# Patient Record
Sex: Female | Born: 1960 | Race: White | Hispanic: No | Smoking: Former smoker
Health system: Southern US, Community
[De-identification: ages and names within clinical notes are randomized; demographics above are authoritative.]

## PROBLEM LIST (undated history)

## (undated) DIAGNOSIS — G43909 Migraine, unspecified, not intractable, without status migrainosus: Secondary | ICD-10-CM

## (undated) DIAGNOSIS — F329 Major depressive disorder, single episode, unspecified: Secondary | ICD-10-CM

## (undated) DIAGNOSIS — Z9889 Other specified postprocedural states: Secondary | ICD-10-CM

## (undated) DIAGNOSIS — F32A Depression, unspecified: Secondary | ICD-10-CM

## (undated) DIAGNOSIS — F419 Anxiety disorder, unspecified: Secondary | ICD-10-CM

## (undated) HISTORY — DX: Anxiety disorder, unspecified: F41.9

## (undated) HISTORY — DX: Other specified postprocedural states: Z98.890

## (undated) HISTORY — DX: Migraine, unspecified, not intractable, without status migrainosus: G43.909

## (undated) HISTORY — PX: HERNIA REPAIR: SHX51

## (undated) HISTORY — PX: OTHER SURGICAL HISTORY: SHX169

## (undated) HISTORY — PX: ABDOMINAL HYSTERECTOMY: SHX81

## (undated) HISTORY — PX: TUBAL LIGATION: SHX77

## (undated) HISTORY — DX: Major depressive disorder, single episode, unspecified: F32.9

## (undated) HISTORY — DX: Depression, unspecified: F32.A

---

## 1999-11-29 HISTORY — PX: BREAST EXCISIONAL BIOPSY: SUR124

## 2006-08-20 ENCOUNTER — Emergency Department (HOSPITAL_COMMUNITY): Admission: EM | Admit: 2006-08-20 | Discharge: 2006-08-20 | Payer: Self-pay | Admitting: Emergency Medicine

## 2007-07-18 ENCOUNTER — Emergency Department (HOSPITAL_COMMUNITY): Admission: EM | Admit: 2007-07-18 | Discharge: 2007-07-18 | Payer: Self-pay | Admitting: Emergency Medicine

## 2008-11-28 HISTORY — PX: BREAST BIOPSY: SHX20

## 2011-09-09 LAB — URINALYSIS, ROUTINE W REFLEX MICROSCOPIC
Bilirubin Urine: NEGATIVE
Glucose, UA: NEGATIVE
Hgb urine dipstick: NEGATIVE
Nitrite: POSITIVE — AB
Protein, ur: NEGATIVE
Specific Gravity, Urine: 1.018
Urobilinogen, UA: 1
pH: 5

## 2011-09-09 LAB — URINE CULTURE: Colony Count: >=100000

## 2011-09-09 LAB — URINE MICROSCOPIC-ADD ON

## 2012-04-04 ENCOUNTER — Ambulatory Visit (INDEPENDENT_AMBULATORY_CARE_PROVIDER_SITE_OTHER): Payer: Self-pay

## 2012-04-04 DIAGNOSIS — K589 Irritable bowel syndrome without diarrhea: Secondary | ICD-10-CM

## 2012-04-20 ENCOUNTER — Ambulatory Visit (INDEPENDENT_AMBULATORY_CARE_PROVIDER_SITE_OTHER): Payer: Self-pay

## 2012-04-20 DIAGNOSIS — K589 Irritable bowel syndrome without diarrhea: Secondary | ICD-10-CM

## 2012-05-03 ENCOUNTER — Ambulatory Visit (INDEPENDENT_AMBULATORY_CARE_PROVIDER_SITE_OTHER): Payer: Self-pay

## 2012-05-03 DIAGNOSIS — K589 Irritable bowel syndrome without diarrhea: Secondary | ICD-10-CM

## 2012-05-18 ENCOUNTER — Ambulatory Visit (INDEPENDENT_AMBULATORY_CARE_PROVIDER_SITE_OTHER): Payer: Self-pay

## 2012-05-18 DIAGNOSIS — K589 Irritable bowel syndrome without diarrhea: Secondary | ICD-10-CM

## 2012-05-23 ENCOUNTER — Emergency Department (HOSPITAL_COMMUNITY): Payer: Managed Care, Other (non HMO)

## 2012-05-23 ENCOUNTER — Emergency Department (HOSPITAL_COMMUNITY)
Admission: EM | Admit: 2012-05-23 | Discharge: 2012-05-23 | Disposition: A | Discharge status: Home / Self Care | Payer: Managed Care, Other (non HMO) | Attending: Emergency Medicine | Admitting: Emergency Medicine

## 2012-05-23 ENCOUNTER — Encounter (HOSPITAL_COMMUNITY): Payer: Self-pay | Admitting: Emergency Medicine

## 2012-05-23 DIAGNOSIS — H538 Other visual disturbances: Secondary | ICD-10-CM | POA: Insufficient documentation

## 2012-05-23 DIAGNOSIS — S0003XA Contusion of scalp, initial encounter: Secondary | ICD-10-CM | POA: Insufficient documentation

## 2012-05-23 DIAGNOSIS — Y93H9 Activity, other involving exterior property and land maintenance, building and construction: Secondary | ICD-10-CM | POA: Insufficient documentation

## 2012-05-23 DIAGNOSIS — W19XXXA Unspecified fall, initial encounter: Secondary | ICD-10-CM

## 2012-05-23 DIAGNOSIS — R51 Headache: Secondary | ICD-10-CM | POA: Insufficient documentation

## 2012-05-23 DIAGNOSIS — W010XXA Fall on same level from slipping, tripping and stumbling without subsequent striking against object, initial encounter: Secondary | ICD-10-CM | POA: Insufficient documentation

## 2012-05-23 DIAGNOSIS — S0990XA Unspecified injury of head, initial encounter: Secondary | ICD-10-CM

## 2012-05-23 IMAGING — CT CT HEAD W/O CM
2 series · 17 of 30 positions shown, 20 images · non-contrast
Comparison: None.

CLINICAL DATA: Fall with forehead injury.  Blurred vision and
nausea.  Headache.

CT HEAD WITHOUT CONTRAST
TECHNIQUE: Contiguous axial images were obtained from the base of
the skull through the vertex without contrast.

[Series 2: head w/o · axial · non-contrast · 0.43mm/px · z∈[-123,-3]mm · 9 of 30 slices shown, 12 images]
[im 3/30  brain]
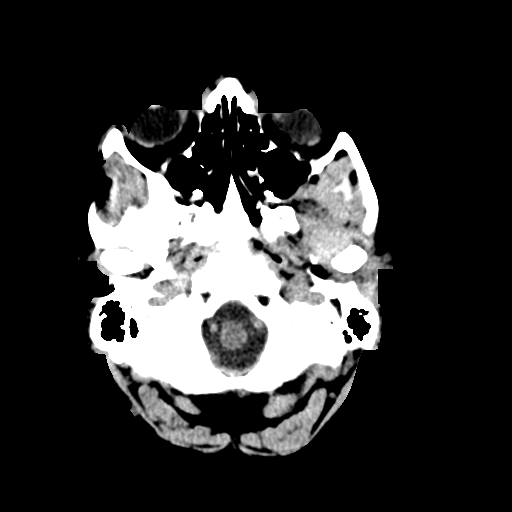
[im 3/30  bone]
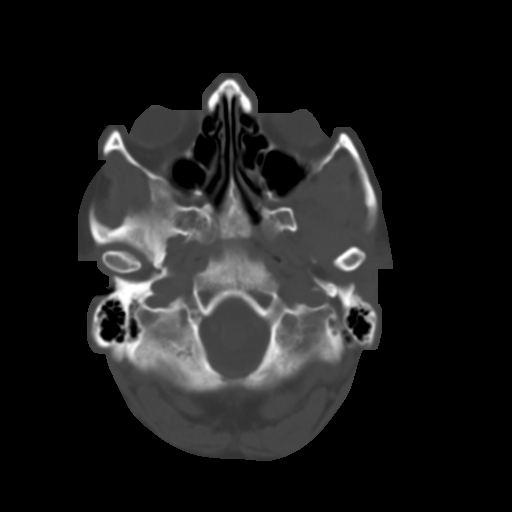
[im 6/30  brain]
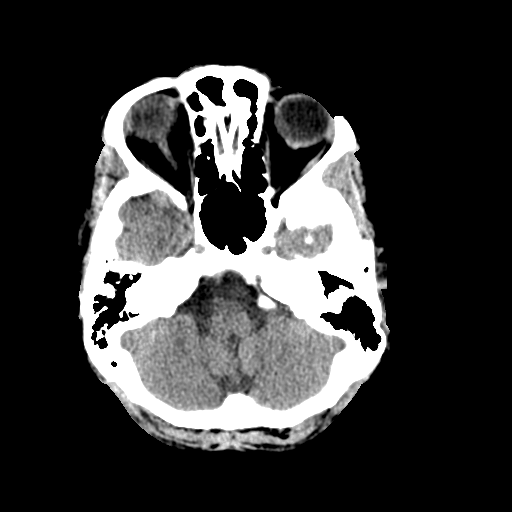
[im 9/30  brain]
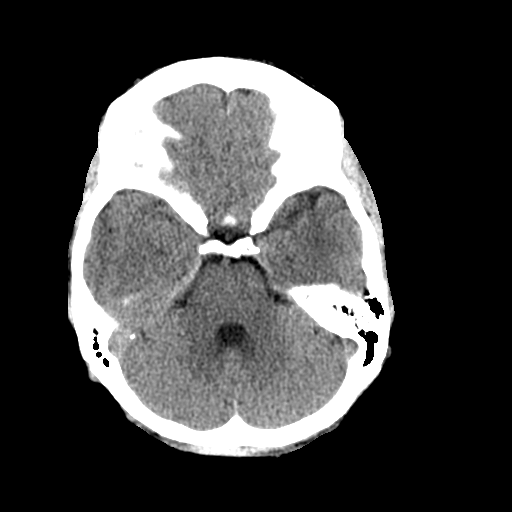
[im 12/30  brain]
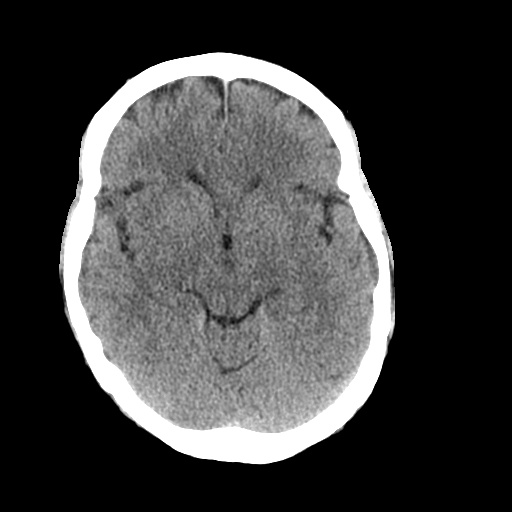
[im 15/30  brain]
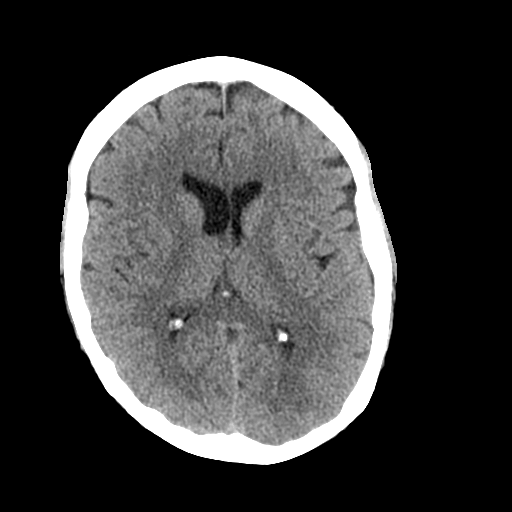
[im 15/30  bone]
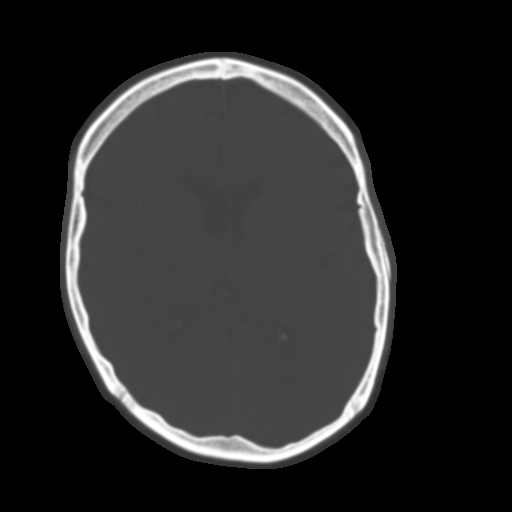
[im 18/30  brain]
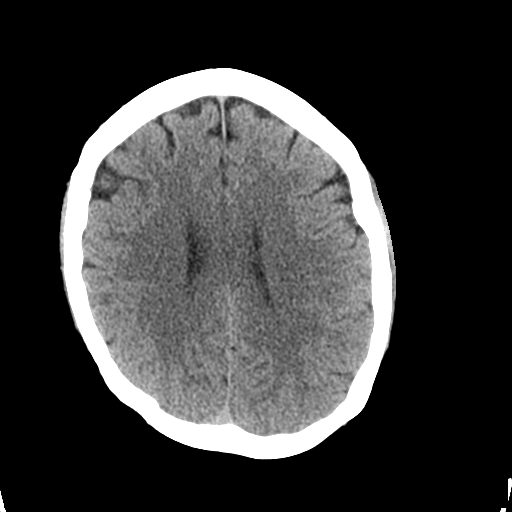
[im 21/30  brain]
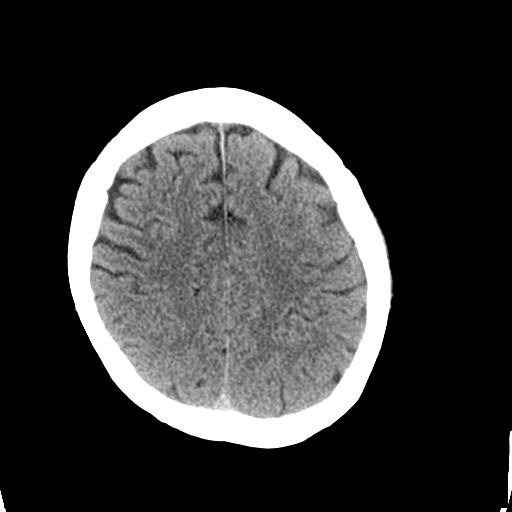
[im 24/30  brain]
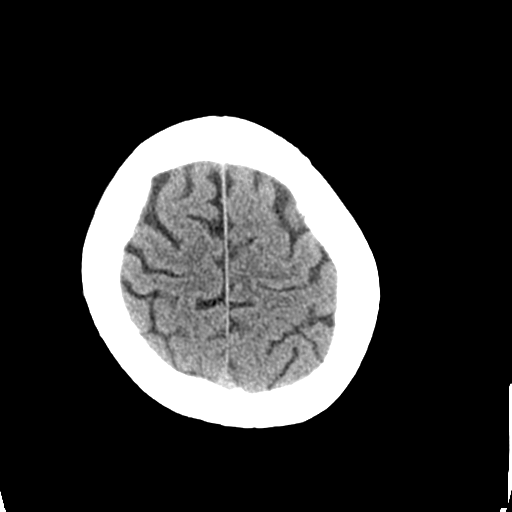
[im 27/30  brain]
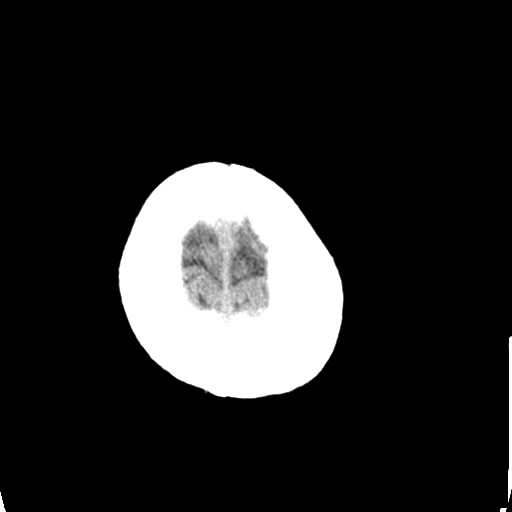
[im 27/30  bone]
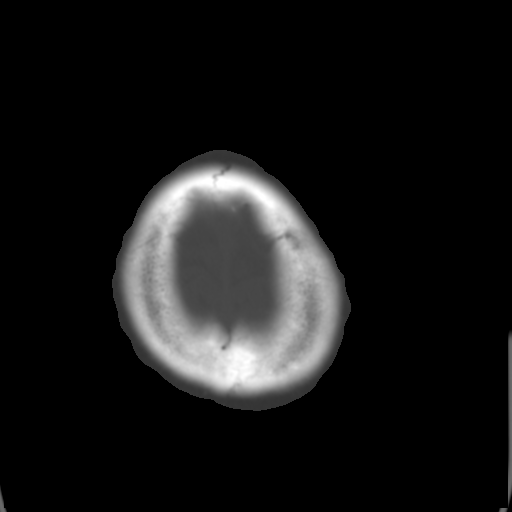

[Series 3: bone windows · axial · 0.43mm/px · z∈[-118,-7]mm · 8 of 49 slices shown]
[im 6/49  bone]
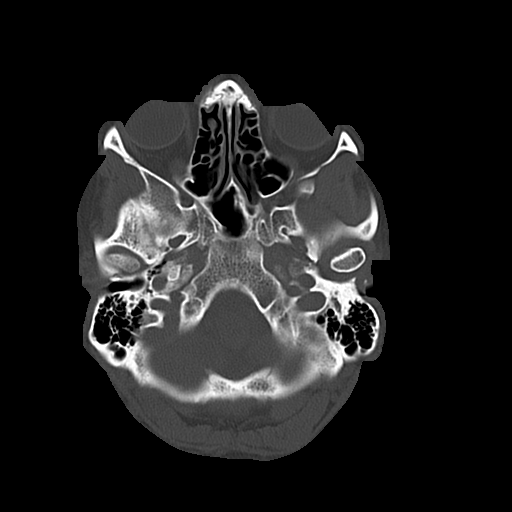
[im 11/49  bone]
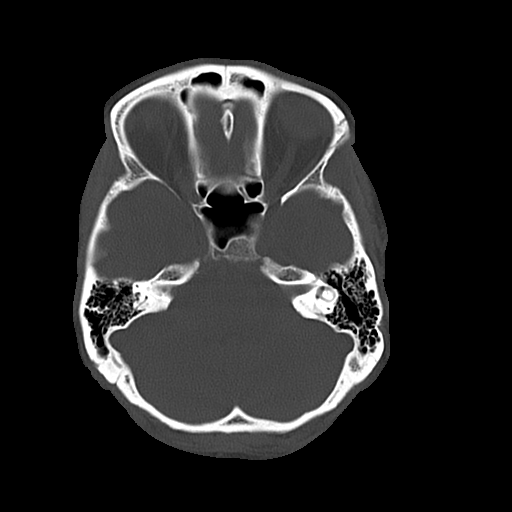
[im 17/49  bone]
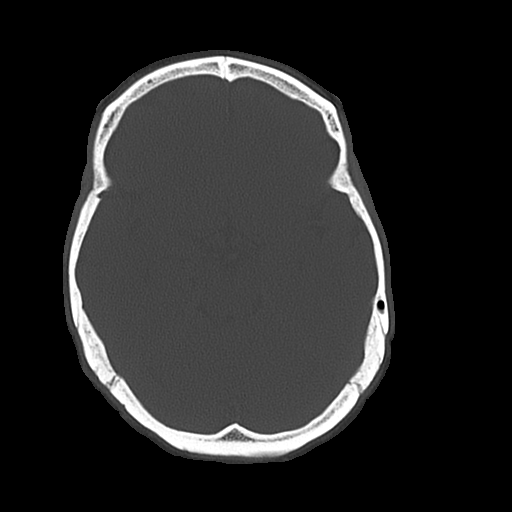
[im 22/49  bone]
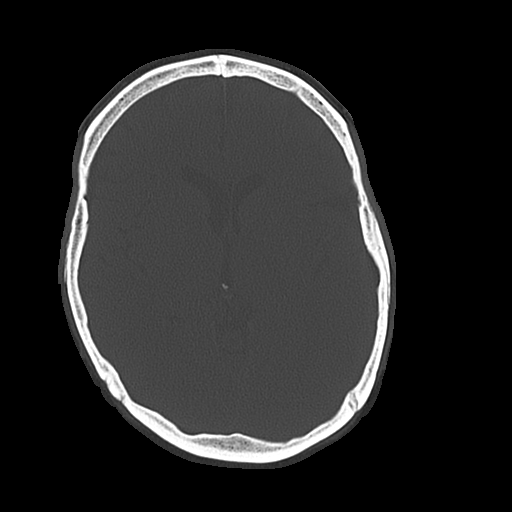
[im 27/49  bone]
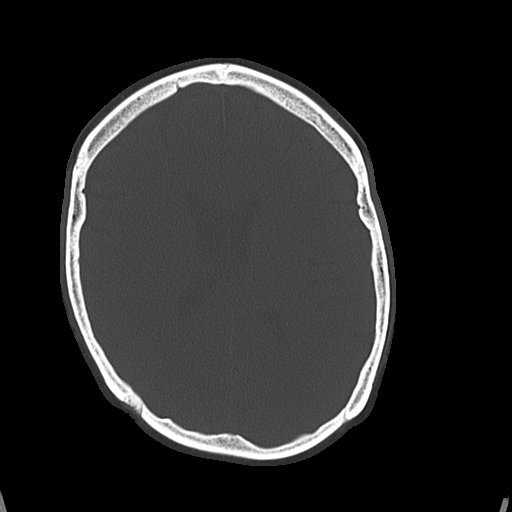
[im 33/49  bone]
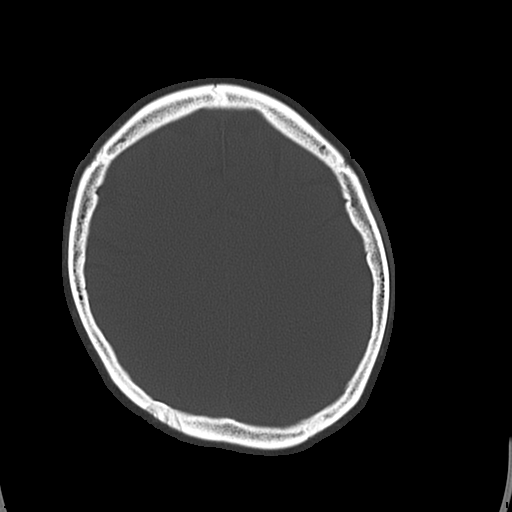
[im 38/49  bone]
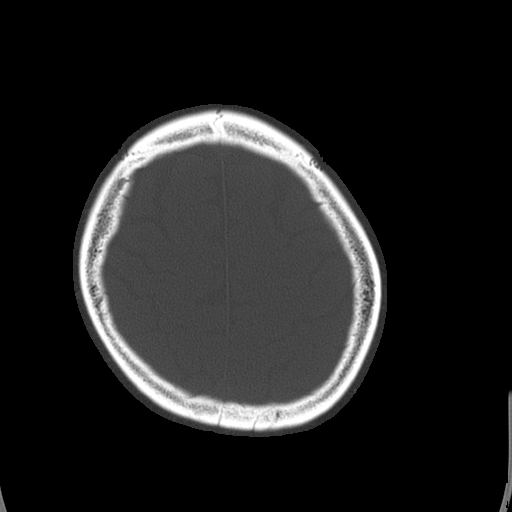
[im 43/49  bone]
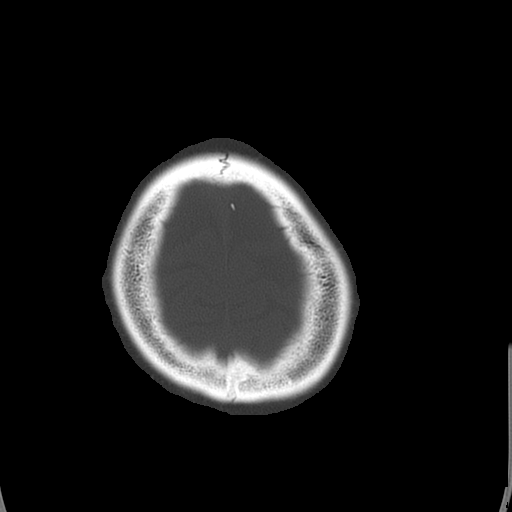

[17 of 30 positions shown; findings below may reference images not displayed]

FINDINGS: The brain stem, cerebellum, cerebral peduncles, thalami,
basal ganglia, basilar cisterns, and ventricular system appear
unremarkable.

No intracranial hemorrhage, mass lesion, or acute infarction is
identified.

Mild soft tissue swelling in the scalp of the left forehead noted.
IMPRESSION: 1.  No significant intracranial abnormality is revealed.
2.  Mild soft tissue swelling along the left forehead.

## 2012-05-23 MED ORDER — NAPROXEN 375 MG PO TABS: 375.0000 mg | ORAL_TABLET | Freq: Two times a day (BID) | ORAL | Status: AC

## 2012-05-23 MED ORDER — ACETAMINOPHEN-CODEINE #3 300-30 MG PO TABS: 1.0000 | ORAL_TABLET | Freq: Four times a day (QID) | ORAL | Status: AC | PRN

## 2012-05-23 NOTE — ED Notes (Signed)
Pt has moderate sized hematoma center of forehead with yellow bruising consistent w/ age of injury.

## 2012-05-23 NOTE — ED Provider Notes (Signed)
History     CSN: [id number]  Arrival date & time 05/23/12  1707   First MD Initiated Contact with Patient 05/23/12 1815      Chief Complaint  Patient presents with  . Fall    (Consider location/radiation/quality/duration/timing/severity/associated sxs/prior treatment) HPI  Patient presents to emergency department complaining of head injury 5 days ago with intermittent ongoing symptoms of headache, blurry vision, nausea, and some stiffness in her lateral neck. Patient drove herself to the emergency department. Patient states that she was taking out trash 5 days ago and tripped falling forward striking her for head on the ground. She denies loss of consciousness but states "I felt very woozy." Patient states that since then she has had intermittent headaches that she's been treating with Tylenol without any relief. She feels intermittent nausea and intermittent blurry vision that lasts seconds. Patient also complaining of gradual onset tightness or stiffness in the lateral aspect of her lower neck. Patient states that she takes Paxil for depression but has no other known medical problems and takes no other medicines on regular basis. She does not have a primary care provider. Patient has had bruising to her for head and her eyes developed over last 5 days. She denies aggravating or alleviating factors. She denies any associated vomiting with the nausea. She states she is ambulating without difficulty.  History reviewed. No pertinent past medical history.  Past Surgical History  Procedure Date  . Abdominal hysterectomy   . Hernia repair   . Cesarean section   . Tubal ligation     No family history on file.  History  Substance Use Topics  . Smoking status: Never Smoker   . Smokeless tobacco: Not on file  . Alcohol Use: 0.6 oz/week    1 Glasses of wine per week    OB History    Grav Para Term Preterm Abortions TAB SAB Ect Mult Living                  Review of Systems  All  other systems reviewed and are negative.    Allergies  Sulfa antibiotics  Home Medications   Current Outpatient Rx  Name Route Sig Dispense Refill  . PAROXETINE HCL 40 MG PO TABS Oral Take 40 mg by mouth daily.      BP 126/79  Pulse 98  Temp 98.4 F (36.9 C)  Resp 20  SpO2 98%  Physical Exam  Nursing note and vitals reviewed. Constitutional: She is oriented to person, place, and time. She appears well-developed and well-nourished. No distress.  HENT:  Head: Normocephalic.       Yellow/green healing bruising of bilateral orbital regions. contusion to left forehead without step off.   Eyes: Conjunctivae and EOM are normal. Pupils are equal, round, and reactive to light.  Neck: Normal range of motion. Neck supple.       Mild TTP of soft tissue of right lateral into lower neck but FROM and no mid line TTP.   Cardiovascular: Normal rate, regular rhythm, normal heart sounds and intact distal pulses.  Exam reveals no gallop and no friction rub.   No murmur heard. Pulmonary/Chest: Effort normal and breath sounds normal. No respiratory distress. She has no wheezes. She has no rales. She exhibits no tenderness.  Abdominal: Soft. Bowel sounds are normal. She exhibits no distension and no mass. There is no tenderness. There is no rebound and no guarding.  Musculoskeletal: Normal range of motion. She exhibits no edema and no tenderness.  Neurological: She is alert and oriented to person, place, and time. No cranial nerve deficit. Coordination normal.  Skin: Skin is warm and dry. No rash noted. She is not diaphoretic. No erythema.  Psychiatric: She has a normal mood and affect.    ED Course  Procedures (including critical care time)  Patient drove self to ER.  Labs Reviewed - No data to display Ct Head Wo Contrast  05/23/2012  *RADIOLOGY REPORT*  Clinical Data: Fall with forehead injury.  Blurred vision and nausea.  Headache.  CT HEAD WITHOUT CONTRAST  Technique:  Contiguous axial  images were obtained from the base of the skull through the vertex without contrast.  Comparison: None.  Findings: The brain stem, cerebellum, cerebral peduncles, thalami, basal ganglia, basilar cisterns, and ventricular system appear unremarkable.  No intracranial hemorrhage, mass lesion, or acute infarction is identified.  Mild soft tissue swelling in the scalp of the left forehead noted.  IMPRESSION:  1.  No significant intracranial abnormality is revealed. 2.  Mild soft tissue swelling along the left forehead.  Original Report Authenticated By: Dellia Cloud, M.D.     1. Fall   2. Minor head injury       MDM  Like concussion with complaints of symptoms with no acute findings on CT scan and patient alert and oriented with no neuro focal deficits and ambulating without difficulty. Spoke at length with patient about changing or worsening of symptoms that should prompt return to ER vs following up with PCP and patient voices understanding and is agreeable to plan.         [place], [place] 05/23/12 1923

## 2012-05-23 NOTE — Discharge Instructions (Signed)
Aternate between naproxen sodium and Tylenol #3 for pain relief but do not drive or operate machinery with Tylenol #3 use. Follow up with urgent care in next week for recheck of ongoing symptoms but return to emergency department for emergent changing or worsening symptoms.  Head Injury, Adult A head injury happens when the head is hit really hard. A head injury may cause sleepiness, headache, throwing up (vomiting), and problems seeing. If the head injury is really bad, you may need to stay in the hospital. HOME CARE  Have someone with you for the first 24 hours. This person should wake you up every 1 hour to check on your condition.   Only drink water or clear fluid for the rest of the day. Then, go back to your regular diet.   Only take medicines as told by your doctor. Do not take aspirin.   Do not drink alcohol for 2 days.   Do not take medicines that help your relax (sedatives) for 2 days.  Side effects may happen for up to 7 to 10 days. Watch for new problems. GET HELP RIGHT AWAY IF:   You are confused or sleepy.   You cannot be woken up.   You feel sick to your stomach (nauseous) or keep throwing up.   Your dizziness or unsteadiness is get worse, or your cannot walk.   You start to shake (convulse) or pass out (faint).   You have very bad, lasting headaches that are not helped by medicine.   You cannot use your arms or legs like normal.   You have clear or bloody fluid coming from your nose or ears.  MAKE SURE YOU:   Understand these instructions.   Will watch your condition.   Will get help right away if you are not doing well or get worse.  Document Released: 10/27/2008 Document Revised: 11/03/2011 Document Reviewed: 09/30/2009 [facility] Patient Information 2012 [place], [place].

## 2012-05-23 NOTE — ED Notes (Signed)
Pt c/o of headache and blurry vision is fall on Saturday. States that she fell and hit her head and since symptoms have gotten worse. Also states that she is experiencing some nausea no vomiting. Denies tingling in hands and feet.

## 2012-05-23 NOTE — ED Notes (Signed)
Pt states fell Saturday evening, tripped on curb, fall resulted in striking her forehead on the concert curb. States was "dazed" for several seconds and then sat there for a few minutes assessing what had happened. Did not seek medical care on Saturday. Today has severe headache, blurry vision and nausea.

## 2012-05-24 NOTE — ED Provider Notes (Signed)
Medical screening examination/treatment/procedure(s) were performed by non-physician practitioner and as supervising physician I was immediately available for consultation/collaboration.   Laray Anger, DO 05/24/12 1242

## 2012-06-14 ENCOUNTER — Ambulatory Visit (INDEPENDENT_AMBULATORY_CARE_PROVIDER_SITE_OTHER): Payer: Managed Care, Other (non HMO)

## 2012-06-14 DIAGNOSIS — K589 Irritable bowel syndrome without diarrhea: Secondary | ICD-10-CM

## 2012-07-12 ENCOUNTER — Ambulatory Visit (INDEPENDENT_AMBULATORY_CARE_PROVIDER_SITE_OTHER): Payer: Managed Care, Other (non HMO)

## 2012-07-12 DIAGNOSIS — K589 Irritable bowel syndrome without diarrhea: Secondary | ICD-10-CM

## 2012-07-25 ENCOUNTER — Ambulatory Visit (INDEPENDENT_AMBULATORY_CARE_PROVIDER_SITE_OTHER): Payer: Managed Care, Other (non HMO)

## 2012-07-25 DIAGNOSIS — K589 Irritable bowel syndrome without diarrhea: Secondary | ICD-10-CM

## 2013-02-05 ENCOUNTER — Emergency Department (HOSPITAL_COMMUNITY)
Admission: EM | Admit: 2013-02-05 | Discharge: 2013-02-05 | Disposition: A | Discharge status: Home / Self Care | Payer: 59 | Source: Home / Self Care | Attending: Family Medicine | Admitting: Family Medicine

## 2013-02-05 ENCOUNTER — Encounter (HOSPITAL_COMMUNITY): Payer: Self-pay | Admitting: Emergency Medicine

## 2013-02-05 DIAGNOSIS — K5289 Other specified noninfective gastroenteritis and colitis: Secondary | ICD-10-CM

## 2013-02-05 DIAGNOSIS — K529 Noninfective gastroenteritis and colitis, unspecified: Secondary | ICD-10-CM

## 2013-02-05 DIAGNOSIS — J069 Acute upper respiratory infection, unspecified: Secondary | ICD-10-CM

## 2013-02-05 MED ORDER — CETIRIZINE HCL 10 MG PO TABS
10.0000 mg | ORAL_TABLET | Freq: Every day | ORAL | Status: AC
Start: 2013-02-05 — End: ?

## 2013-02-05 MED ORDER — ONDANSETRON 4 MG PO TBDP
4.0000 mg | ORAL_TABLET | Freq: Once | ORAL | Status: AC
  Administered 2013-02-05: 4 mg via ORAL

## 2013-02-05 MED ORDER — ONDANSETRON HCL 4 MG PO TABS: 4.0000 mg | ORAL_TABLET | Freq: Four times a day (QID) | ORAL | Status: DC

## 2013-02-05 MED ORDER — ONDANSETRON 4 MG PO TBDP
ORAL_TABLET | ORAL | Status: AC
  Filled 2013-02-05: qty 1

## 2013-02-05 MED ORDER — IPRATROPIUM BROMIDE 0.06 % NA SOLN: 2.0000 | Freq: Four times a day (QID) | NASAL | Status: DC

## 2013-02-05 NOTE — ED Provider Notes (Signed)
History     CSN: [id number]  Arrival date & time 02/05/13  1741   First MD Initiated Contact with Patient 02/05/13 1800      Chief Complaint  Patient presents with  . URI    ringing in ears. dizziness. lose of voice with tightness. low grade temp    (Consider location/radiation/quality/duration/timing/severity/associated sxs/prior treatment) Patient is a 52 y.o. female presenting with URI. The history is provided by the patient.  URI Presenting symptoms: congestion, ear pain and rhinorrhea   Presenting symptoms: no cough and no fever   Severity:  Mild Onset quality:  Gradual Duration:  11 days Progression:  Improving (dizziness and cong 11d ago with n/d over past 3 days.) Chronicity:  New   History reviewed. No pertinent past medical history.  Past Surgical History  Procedure Laterality Date  . Abdominal hysterectomy    . Hernia repair    . Cesarean section    . Tubal ligation      History reviewed. No pertinent family history.  History  Substance Use Topics  . Smoking status: Never Smoker   . Smokeless tobacco: Not on file  . Alcohol Use: 0.6 oz/week    1 Glasses of wine per week    OB History   Grav Para Term Preterm Abortions TAB SAB Ect Mult Living                  Review of Systems  Constitutional: Positive for activity change and appetite change. Negative for fever.  HENT: Positive for ear pain, congestion and rhinorrhea.   Respiratory: Negative for cough.   Gastrointestinal: Positive for nausea and diarrhea. Negative for vomiting.  Genitourinary: Negative for urgency.  Neurological: Positive for dizziness.    Allergies  Sulfa antibiotics  Home Medications   Current Outpatient Rx  Name  Route  Sig  Dispense  Refill  . PARoxetine (PAXIL) 40 MG tablet   Oral   Take 40 mg by mouth daily.         . cetirizine (ZYRTEC) 10 MG tablet   Oral   Take 1 tablet (10 mg total) by mouth daily. One tab daily for allergies   30 tablet   1   .  ipratropium (ATROVENT) 0.06 % nasal spray   Nasal   Place 2 sprays into the nose 4 (four) times daily.   15 mL   1   . naproxen (NAPROSYN) 375 MG tablet   Oral   Take 1 tablet (375 mg total) by mouth 2 (two) times daily.   20 tablet   0   . ondansetron (ZOFRAN) 4 MG tablet   Oral   Take 1 tablet (4 mg total) by mouth every 6 (six) hours.   8 tablet   0     BP 116/78  Pulse 75  Temp(Src) 98.1 F (36.7 C) (Oral)  Resp 20  SpO2 99%  Physical Exam  Nursing note and vitals reviewed. Constitutional: She is oriented to person, place, and time. She appears well-developed and well-nourished.  HENT:  Head: Normocephalic.  Right Ear: External ear normal.  Left Ear: External ear normal.  Mouth/Throat: Oropharynx is clear and moist.  Eyes: Conjunctivae and EOM are normal. Pupils are equal, round, and reactive to light.  Neck: Normal range of motion. Neck supple.  Cardiovascular: Normal rate, regular rhythm, normal heart sounds and intact distal pulses.   Pulmonary/Chest: Effort normal and breath sounds normal.  Abdominal: Bowel sounds are normal. She exhibits no distension and no  mass. There is no tenderness. There is no rebound and no guarding.  Lymphadenopathy:    She has no cervical adenopathy.  Neurological: She is alert and oriented to person, place, and time.  Skin: Skin is warm and dry.    ED Course  Procedures (including critical care time)  Labs Reviewed - No data to display No results found.   1. URI (upper respiratory infection)   2. Gastroenteritis       MDM          Linna Hoff, MD 02/05/13 [phone]

## 2013-02-05 NOTE — ED Notes (Signed)
Pt c/o lose of voice. Throat tightness. Fatigue. Dizziness and low grade temp. Diarrhea and nausea Symptoms present for a week. Pt has used mucinex with no relief.  Denies vomiting and cough.

## 2013-02-13 ENCOUNTER — Other Ambulatory Visit: Payer: Self-pay | Admitting: Internal Medicine

## 2013-07-05 ENCOUNTER — Other Ambulatory Visit: Payer: Self-pay | Admitting: Family Medicine

## 2013-07-05 ENCOUNTER — Other Ambulatory Visit (HOSPITAL_COMMUNITY)
Admission: RE | Admit: 2013-07-05 | Discharge: 2013-07-05 | Disposition: A | Discharge status: Home / Self Care | Payer: 59 | Source: Ambulatory Visit | Attending: Family Medicine | Admitting: Family Medicine

## 2013-07-05 DIAGNOSIS — Z113 Encounter for screening for infections with a predominantly sexual mode of transmission: Secondary | ICD-10-CM | POA: Insufficient documentation

## 2013-07-05 DIAGNOSIS — Z124 Encounter for screening for malignant neoplasm of cervix: Secondary | ICD-10-CM | POA: Insufficient documentation

## 2013-07-05 DIAGNOSIS — Z1151 Encounter for screening for human papillomavirus (HPV): Secondary | ICD-10-CM | POA: Insufficient documentation

## 2013-08-15 ENCOUNTER — Other Ambulatory Visit: Payer: Self-pay

## 2013-08-15 DIAGNOSIS — Z1231 Encounter for screening mammogram for malignant neoplasm of breast: Secondary | ICD-10-CM

## 2013-09-04 ENCOUNTER — Ambulatory Visit: Payer: 59

## 2013-09-09 ENCOUNTER — Ambulatory Visit: Payer: 59

## 2013-09-19 ENCOUNTER — Ambulatory Visit
Admission: RE | Admit: 2013-09-19 | Discharge: 2013-09-19 | Disposition: A | Discharge status: Home / Self Care | Payer: 59 | Source: Ambulatory Visit

## 2013-09-19 DIAGNOSIS — Z1231 Encounter for screening mammogram for malignant neoplasm of breast: Secondary | ICD-10-CM

## 2013-09-19 IMAGING — MG STANDARD SCREENING - COMBO
8 series · 8 of 24 positions shown · non-contrast
Comparison: None.

CLINICAL DATA: Screening.

EXAM:
DIGITAL SCREENING BILATERAL MAMMOGRAM WITH CAD
DIGITAL BREAST TOMOSYNTHESIS
Digital breast tomosynthesis images are acquired in two projections.
These images are reviewed in combination with the digital mammogram,
confirming the findings below.

[L CC]
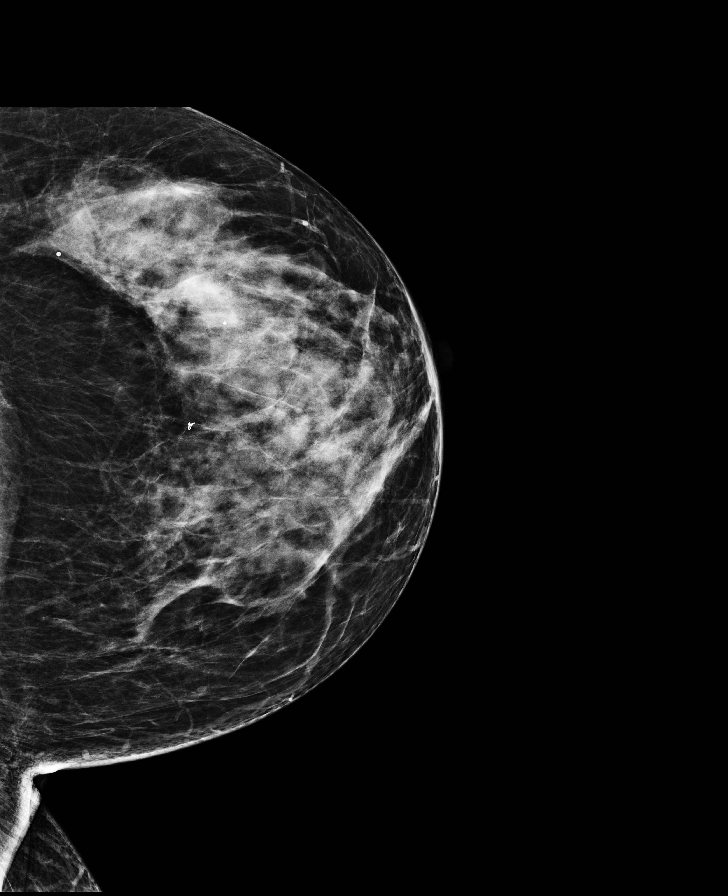

[R CC]
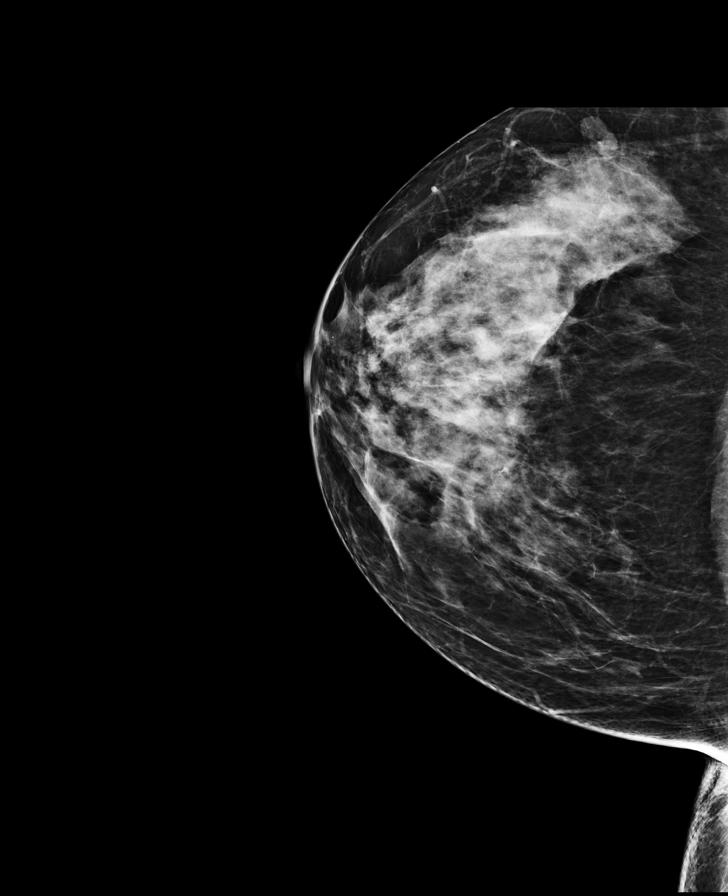

[L MLO]
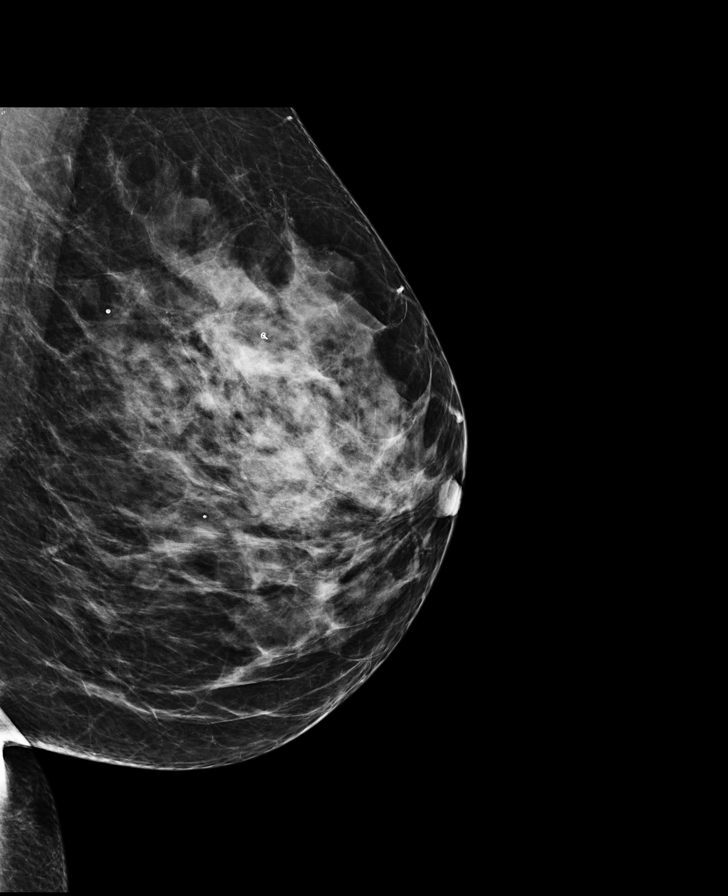

[R MLO]
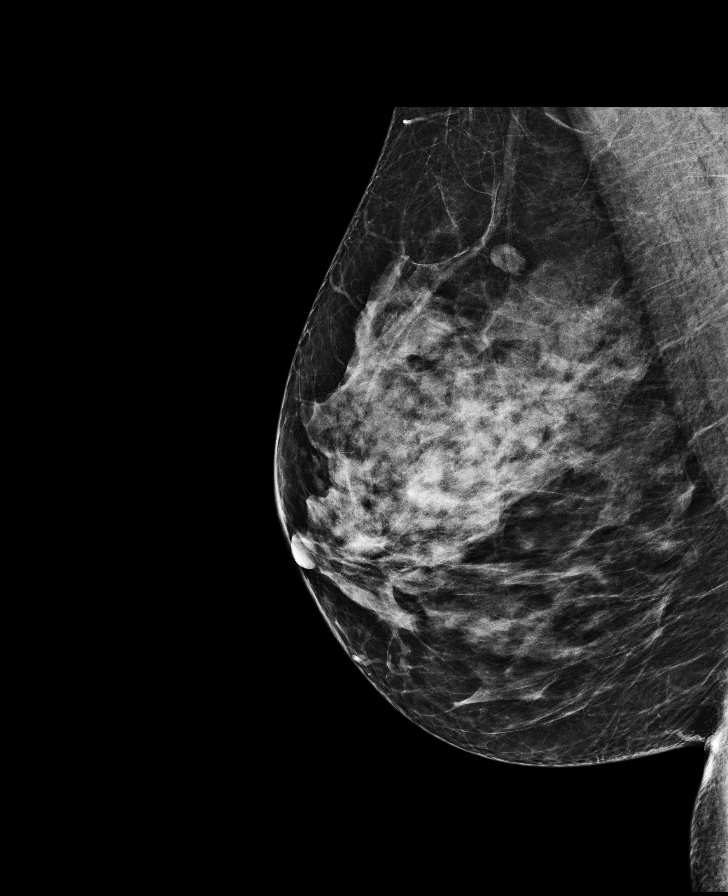

[L CC tomo · tomo slice 39/76.0]
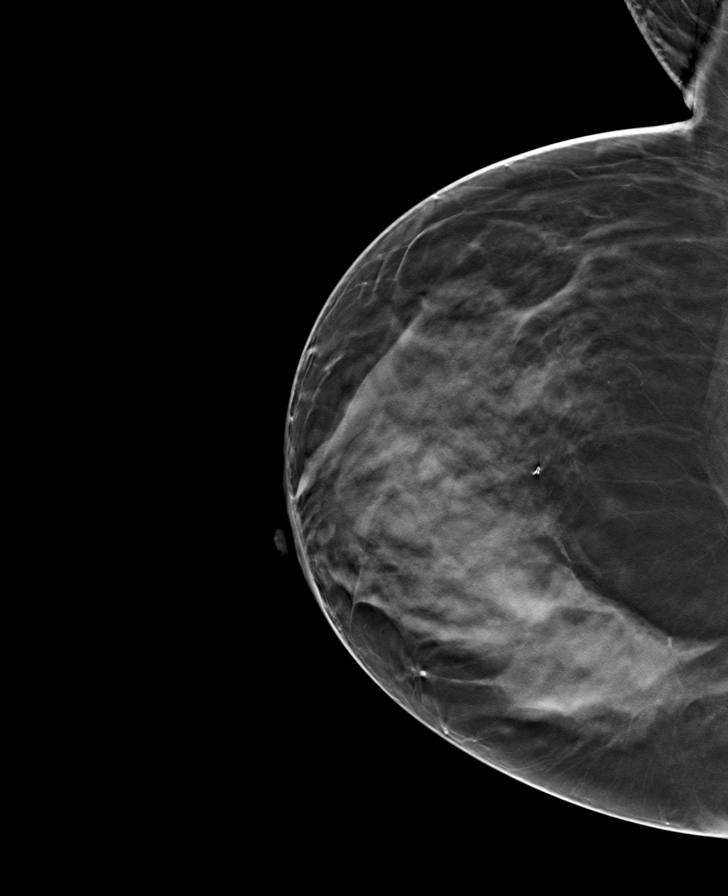

[R CC tomo · tomo slice 36/71.0]
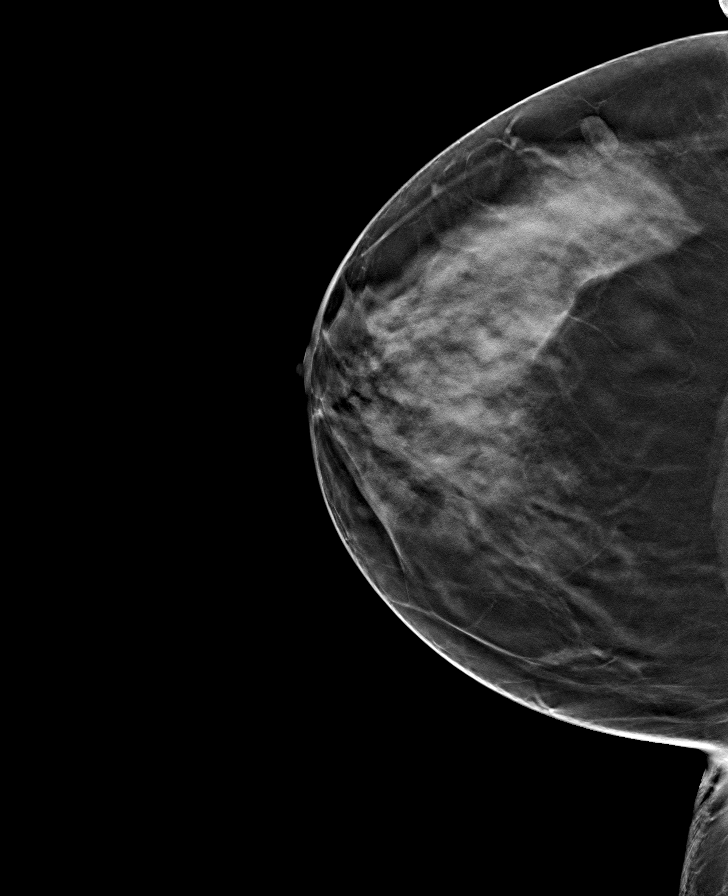

[R MLO tomo · tomo slice 35/69.0]
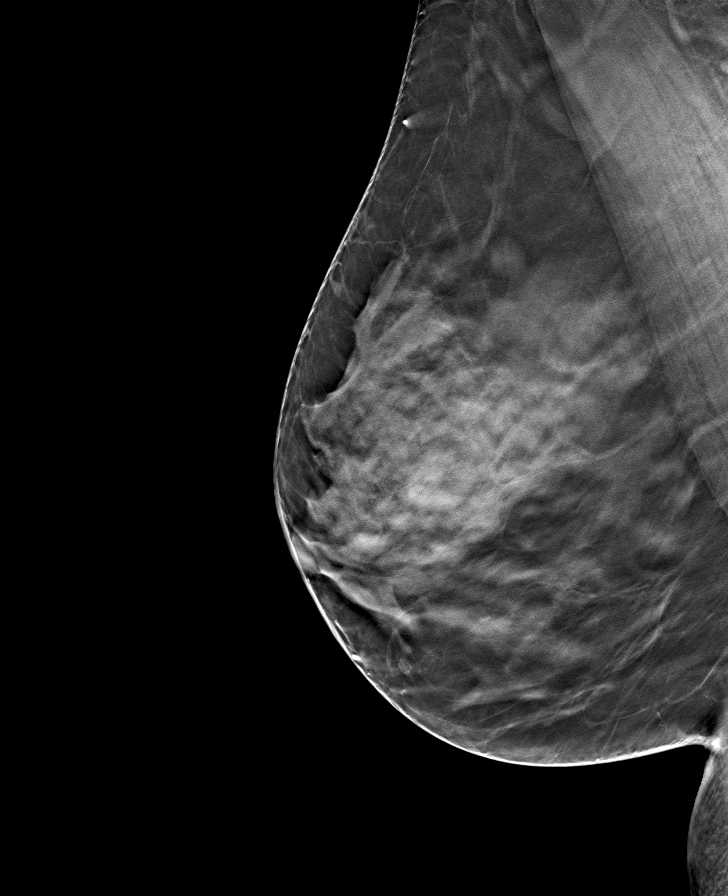

[L MLO tomo · tomo slice 35/69.0]
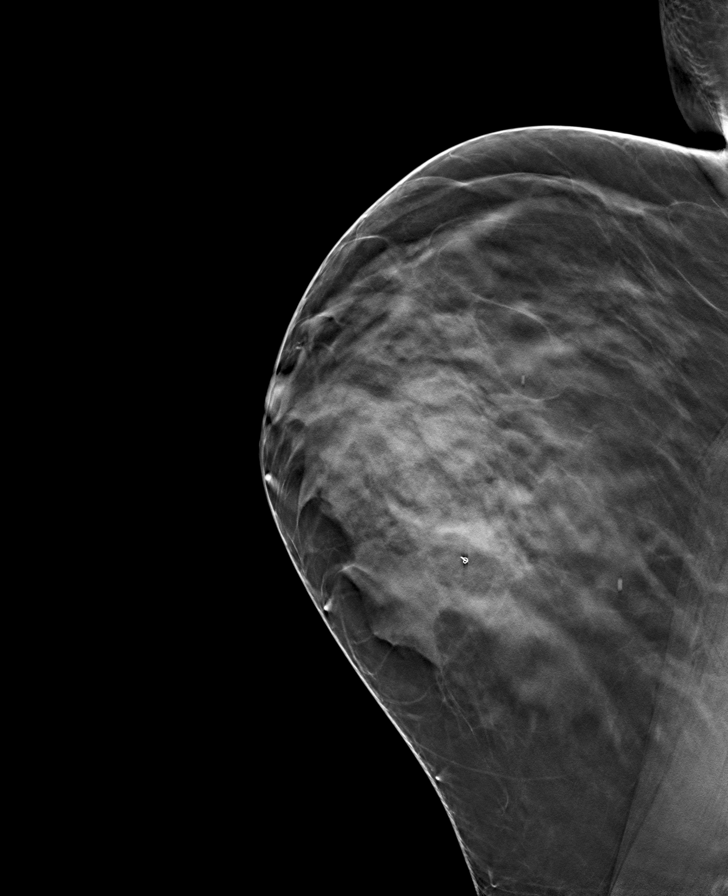

[8 of 24 positions shown; findings below may reference images not displayed]

ACR Breast Density Category d: The breasts are extremely dense,
which lowers the sensitivity of mammography.
FINDINGS: There are no findings suspicious for malignancy. Images were
processed with CAD.
IMPRESSION: No mammographic evidence of malignancy. A result letter of this
screening mammogram will be mailed directly to the patient.

RECOMMENDATION:
Screening mammogram in one year. (Code:[81])

BI-RADS CATEGORY  2: Benign Finding(s)

## 2014-12-23 ENCOUNTER — Other Ambulatory Visit: Payer: Self-pay | Admitting: Foot & Ankle Surgery

## 2015-01-13 ENCOUNTER — Telehealth: Payer: Self-pay | Admitting: *Deleted

## 2015-01-13 ENCOUNTER — Encounter: Payer: Self-pay | Admitting: Neurology

## 2015-01-13 ENCOUNTER — Ambulatory Visit (INDEPENDENT_AMBULATORY_CARE_PROVIDER_SITE_OTHER): Payer: 59 | Admitting: Neurology

## 2015-01-13 ENCOUNTER — Ambulatory Visit
Admission: RE | Admit: 2015-01-13 | Discharge: 2015-01-13 | Disposition: A | Discharge status: Home / Self Care | Payer: 59 | Source: Ambulatory Visit | Attending: Neurology | Admitting: Neurology

## 2015-01-13 VITALS — BP 104/72 | HR 84 | Ht 67.0 in | Wt 199.0 lb

## 2015-01-13 DIAGNOSIS — G609 Hereditary and idiopathic neuropathy, unspecified: Secondary | ICD-10-CM

## 2015-01-13 DIAGNOSIS — S84802A Injury of other nerves at lower leg level, left leg, initial encounter: Secondary | ICD-10-CM

## 2015-01-13 DIAGNOSIS — M722 Plantar fascial fibromatosis: Secondary | ICD-10-CM

## 2015-01-13 IMAGING — US US EXTREM LOW*L* LIMITED
1 series · 14 of 20 positions shown · non-contrast
Comparison: None.

CLINICAL DATA: Pain in popliteal fossa.

EXAM:
ULTRASOUND left LOWER EXTREMITY LIMITED
TECHNIQUE: Ultrasound examination of the lower extremity soft tissues was
performed in the area of clinical concern.

[Series 1: us extrem low*left* limited · 0.08mm/px · 14 of 20 slices shown]
[im 1/20]
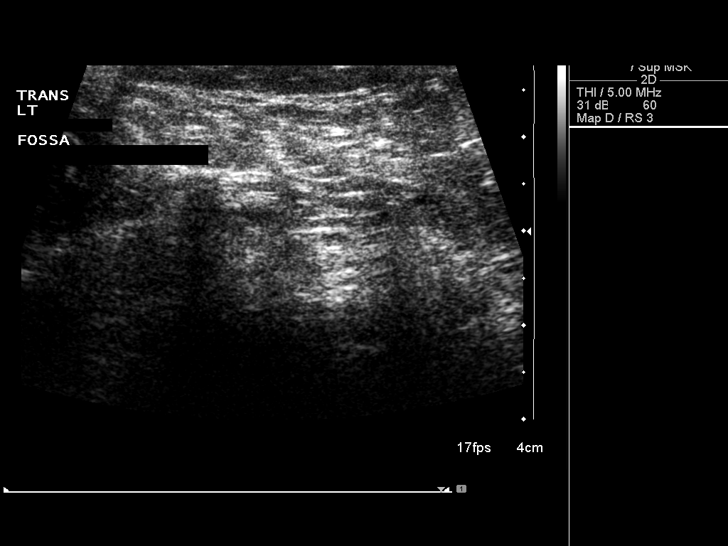
[im 3/20]
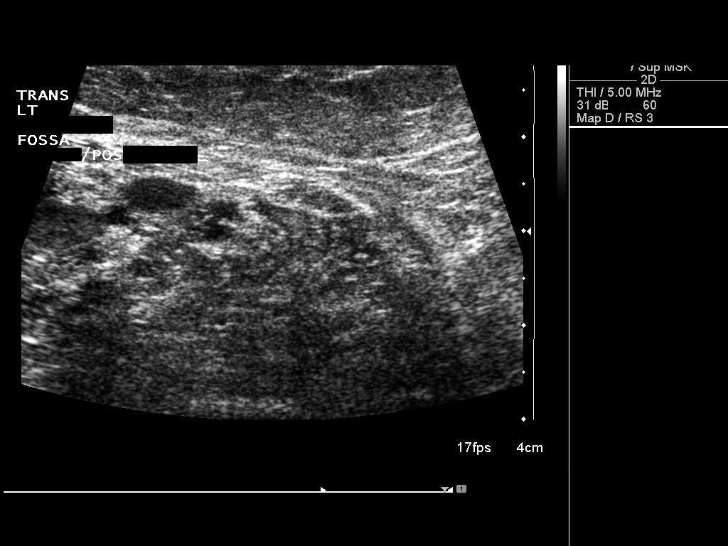
[im 4/20]
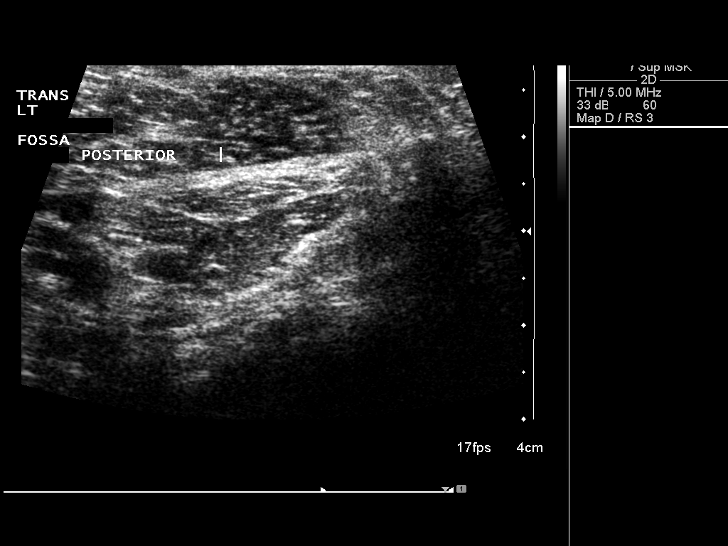
[im 6/20]
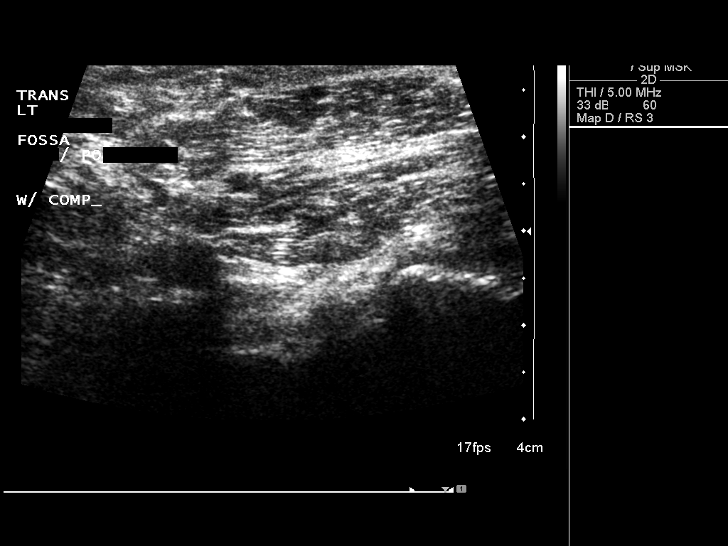
[im 7/20]
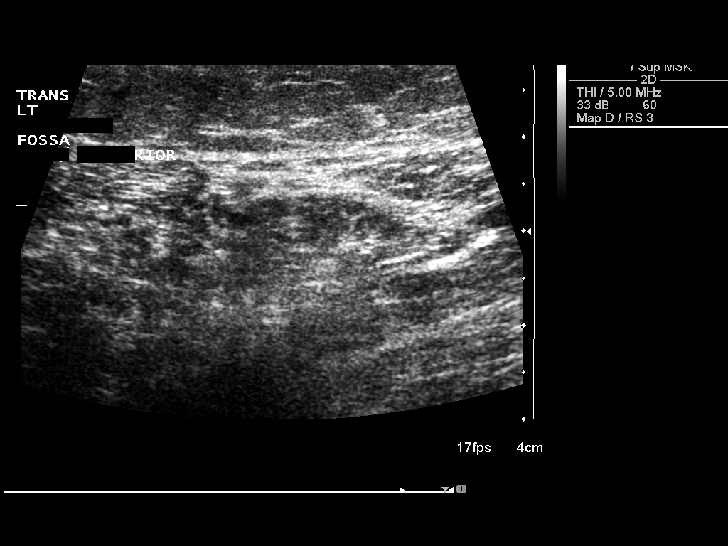
[im 8/20]
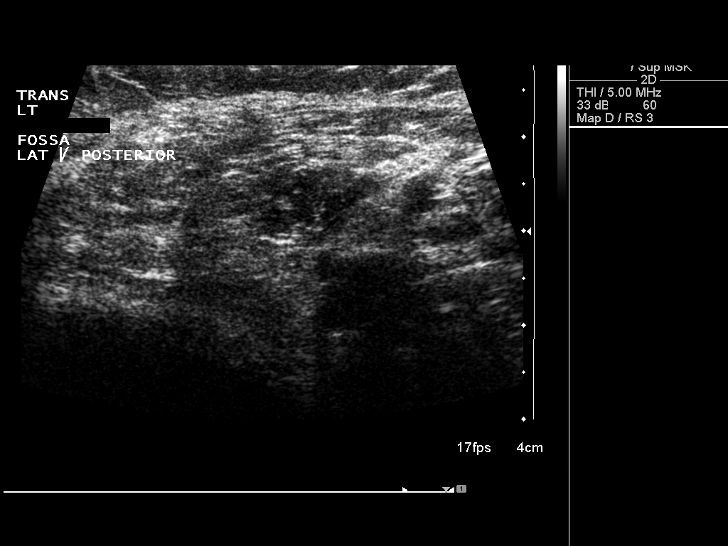
[im 10/20]
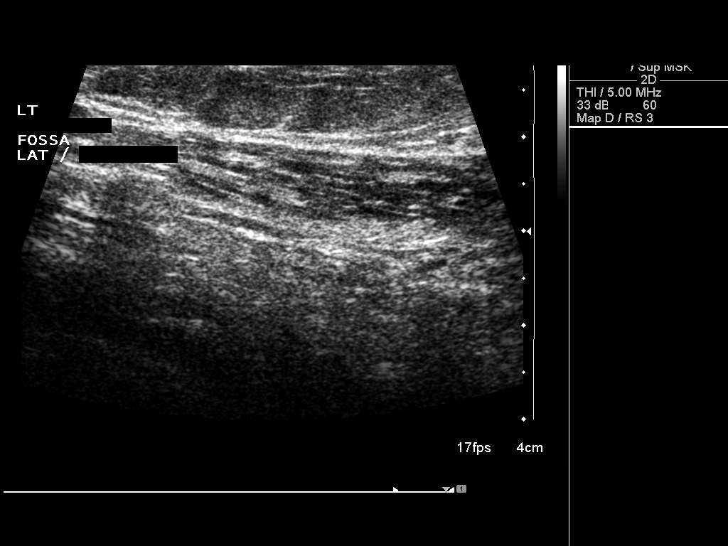
[im 11/20]
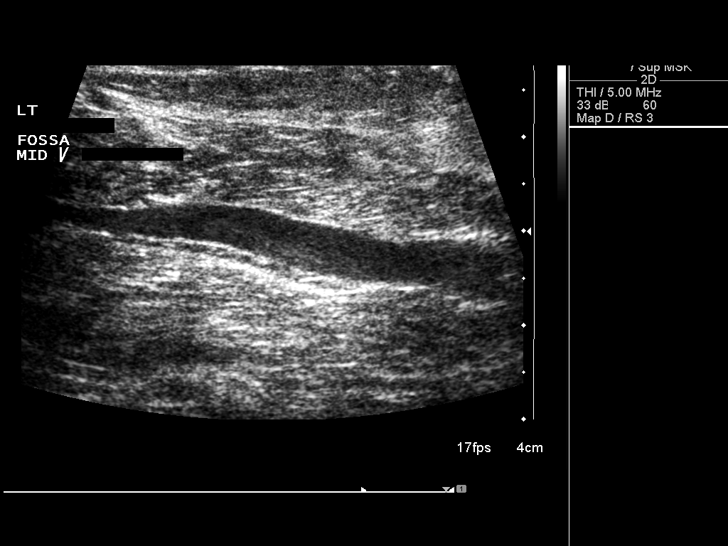
[im 13/20]
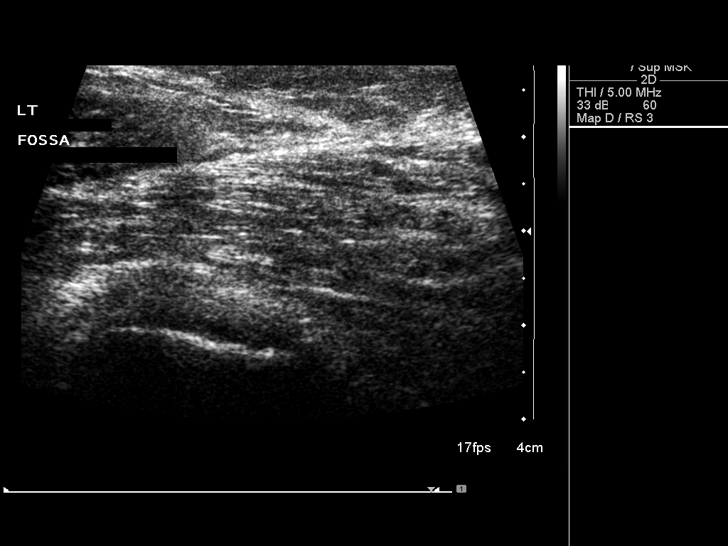
[im 14/20]
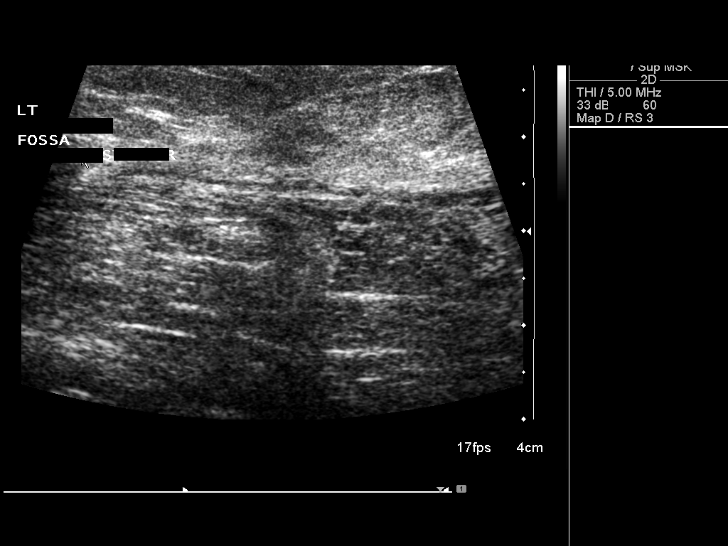
[im 16/20]
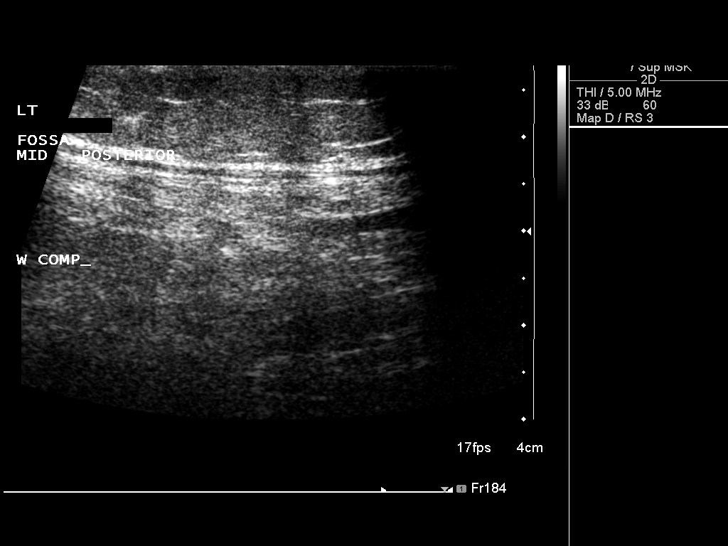
[im 17/20]
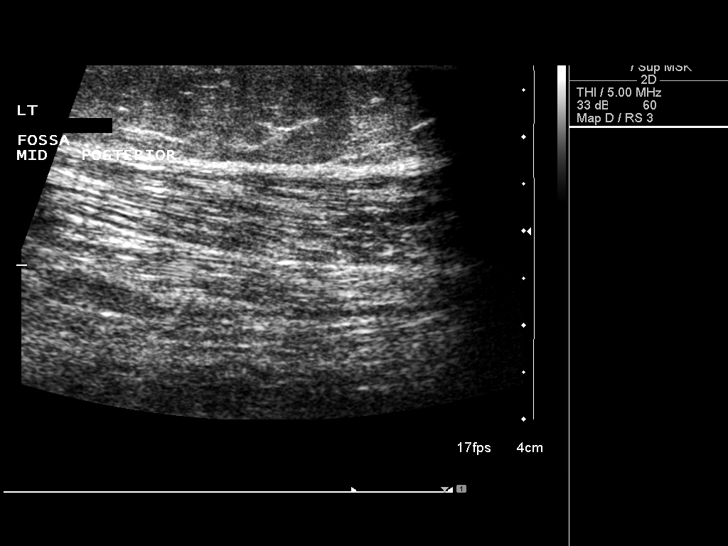
[im 18/20]
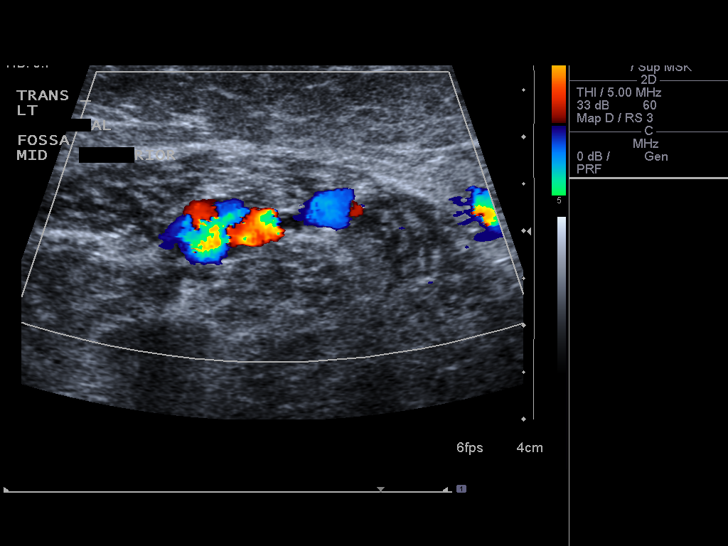
[im 20/20]
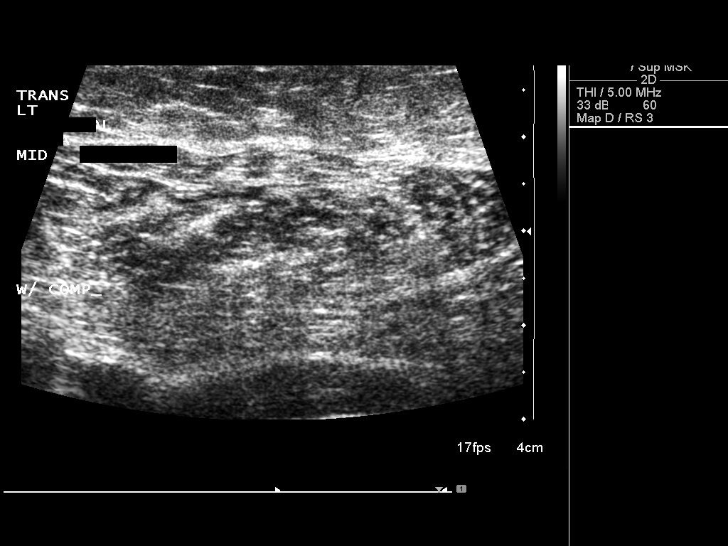

[14 of 20 positions shown; findings below may reference images not displayed]

FINDINGS: Popliteal fossa appears normal. No cystic or solid lesions
identified. Venous structures in the popliteal fossa patent.
IMPRESSION: Normal exam.  If symptoms persist MRI can be obtained.

## 2015-01-13 MED ORDER — OXYCODONE-ACETAMINOPHEN 10-325 MG PO TABS: 1.0000 | ORAL_TABLET | ORAL | Status: DC | PRN

## 2015-01-13 NOTE — Progress Notes (Addendum)
[facility]    Provider:  Dr Jaynee Eagles Referring Provider: Francee Piccolo, MD Primary Care Physician:  Gavin Pound, MD  CC:  Left foot pain  HPI:  Autumn Hicks is a 54 y.o. female here as a referral from Dr. Melony Overly for Neuritis  She has severe pain after plantar fasciitis repair. Lyrica is helping. She has been on the Lyrica for about a week. She had a popliteal block during the procedure and unsure if that has caused her pain. It is in the left foot. She has swelling in the top of the left foot. The pain feels like someone is drilling through her foot. Numb and tingly in the great toe. She can't wear any kind of shoe due to pain. From the moment she woke up from her surgery she has felt this pain. She was placed in a cam walker boot but the pain started even before that. She had a popliteal block for the surgery. No LBP. Has generalized weakness as well but denies focal weakness in the left leg/foot. No foot drop. Just generalized weakness. Has tingling and pain in the left foot, the bottom and dorsum. Sleeping is uncomfortable. Can't touch the foot. Whole bottom of the foot painful as well.     Ct of the head 05/23/2012 showed no acute intracranial abnormalities including mass lesion or mass effect, hydrocephalus, extra-axial fluid collection, midline shift, hemorrhage, or acute infarction, large ischemic events (personally reviewed images)   Review of Systems: Patient complains of symptoms per HPI as well as the following symptoms: weight gain, fatigue, blurred vision, easy bruising, swelling in leg, snoring, feeling hot, feeling cold, flushing, ringing in ears, spinning sensation, incontinence, constipation, joint pain, aching muscles, rash, incontinence, allergies, runny nose, memory loss, headache, numbness, weakness, insomnia, snoring, restless legs, dizziness, depression, anxiety, toomuch sleep, decreased energy. Pertinent negatives per HPI. All others  negative.   History   Social History  . Marital Status: Divorced    Spouse Name: N/A  . Number of Children: 1  . Years of Education: College   Occupational History  .  [employer]   Social History Main Topics  . Smoking status: Former Smoker    Quit date: 11/29/2011  . Smokeless tobacco: Not on file  . Alcohol Use: 0.6 oz/week    1 Glasses of wine per week  . Drug Use: No  . Sexual Activity: Yes    Birth Control/ Protection: Condom   Other Topics Concern  . Not on file   Social History Narrative   Lives at home with Autumn Hicks and Autumn Hicks.   Has one child.   Caffeine: I cup of tea/day     Family History  Problem Relation Age of Onset  . Breast cancer      Past Medical History  Diagnosis Date  . History of fasciotomy     topaz fasciotomy   . Migraine   . Depression   . Anxiety     Past Surgical History  Procedure Laterality Date  . Abdominal hysterectomy    . Hernia repair    . Cesarean section    . Tubal ligation    . Breast lumpectomy Bilateral   . Left wrist      Current Outpatient Prescriptions  Medication Sig Dispense Refill  . cetirizine (ZYRTEC) 10 MG tablet Take 1 tablet (10 mg total) by mouth daily. One tab daily for allergies 30 tablet 1  . cholecalciferol (VITAMIN D) 1000 UNITS tablet Take 2,000 Units  by mouth daily.    . Esomeprazole Magnesium (NEXIUM PO) Take 22.3 mg by mouth.    Marland Kitchen ipratropium (ATROVENT) 0.06 % nasal spray Place 2 sprays into the nose 4 (four) times daily. (Patient not taking: Reported on 01/13/2015) 15 mL 1  . ondansetron (ZOFRAN) 4 MG tablet Take 1 tablet (4 mg total) by mouth every 6 (six) hours. 8 tablet 0  . PARoxetine (PAXIL) 40 MG tablet Take 20 mg by mouth daily.     . pregabalin (LYRICA) 150 MG capsule Take 150 mg by mouth 2 (two) times daily.    . Turmeric 450 MG CAPS Take 450 mg by mouth.    . Zinc 50 MG CAPS Take 50 mg by mouth.     No current facility-administered medications for this visit.     Allergies as of 01/13/2015 - Review Complete 01/13/2015  Allergen Reaction Noted  . Sulfa antibiotics Itching 05/23/2012    Vitals: BP 104/72 mmHg  Pulse 84  Ht 5\' 7"  (1.702 m)  Wt 199 lb (90.266 kg)  BMI 31.16 kg/m2 Last Weight:  Wt Readings from Last 1 Encounters:  01/13/15 199 lb (90.266 kg)   Last Height:   Ht Readings from Last 1 Encounters:  01/13/15 5\' 7"  (1.702 m)    Physical exam: Exam: Gen: NAD, conversant, well nourised, well groomed                     CV: RRR, no MRG. No Carotid Bruits. No peripheral edema, warm, nontender Eyes: Conjunctivae clear without exudates or hemorrhage  Neuro: Detailed Neurologic Exam  Speech:    Speech is normal; fluent and spontaneous with normal comprehension.  Cognition:    The patient is oriented to person, place, and time;     recent and remote memory intact;     language fluent;     normal attention, concentration,     fund of knowledge Cranial Nerves:    The pupils are equal, round, and reactive to light. The fundi are normal and spontaneous venous pulsations are present. Visual fields are full to finger confrontation. Extraocular movements are intact. Trigeminal sensation is intact and the muscles of mastication are normal. The face is symmetric. The palate elevates in the midline. Hearing intact. Voice is normal. Shoulder shrug is normal. The tongue has normal motion without fasciculations.   Coordination:    Normal finger to nose and heel to shin.    Gait:    Heel-toe and tandem gait are normal.   Motor Observation:    No asymmetry, no atrophy, and no involuntary movements noted. Tone:    Normal muscle tone.    Posture:    Posture is normal. normal erect    Strength:     Strength is V/V in the upper and lower limbs.      Sensation: decreased sensation as well as allodynia and hyperalgesia  in the dorsum and bottom of the right foot, extending to right above the ankls.      Reflex Exam:  DTR's:     Deep tendon reflexes in the upper and lower extremities are normal bilaterally.   Toes:    The toes are downgoing bilaterally.   Clonus:    Clonus is absent.      Assessment/Plan:  54 year old with severe left foot pain after plantar fasciitis repair and popliteal nerve block.    Need MRi imaging report from [facility] of the left foot, will request records EMG/NCS of the left  leg US of the left popliteal fossa. Continue Lyrica    Sarina Ill, MD  [facility] [address] [place], [place] [address]  Phone [phone] Fax [phone]

## 2015-01-13 NOTE — Telephone Encounter (Signed)
Talked with patient about normal popliteal fossa results. Patient verbalized understanding.

## 2015-01-13 NOTE — Patient Instructions (Signed)
Overall you are doing fairly well but I do want to suggest a few things today:   Remember to drink plenty of fluid, eat healthy meals and do not skip any meals. Try to eat protein with a every meal and eat a healthy snack such as fruit or nuts in between meals. Try to keep a regular sleep-wake schedule and try to exercise daily, particularly in the form of walking, 20-30 minutes a day, if you can.   As far as your medications are concerned, I would like to suggest: Will give percocet for the test  As far as diagnostic testing: EMG/NCS, US of the left pop fossa  I would like to see you back for emg/ncs, sooner if we need to. Please call us with any interim questions, concerns, problems, updates or refill requests.   Please also call us for any test results so we can go over those with you on the phone.  My clinical assistant and will answer any of your questions and relay your messages to me and also relay most of my messages to you.   Our phone number is [phone]. We also have an after hours call service for urgent matters and there is a physician on-call for urgent questions. For any emergencies you know to call 911 or go to the nearest emergency room

## 2015-01-15 ENCOUNTER — Telehealth: Payer: Self-pay | Admitting: *Deleted

## 2015-01-15 NOTE — Telephone Encounter (Signed)
Report receive from [place] on Dr. Jaynee Eagles desk.

## 2015-01-15 NOTE — Telephone Encounter (Signed)
Request fax to [facility] requesting MRi left foot.

## 2015-01-19 ENCOUNTER — Ambulatory Visit (INDEPENDENT_AMBULATORY_CARE_PROVIDER_SITE_OTHER): Payer: 59 | Admitting: Neurology

## 2015-01-19 ENCOUNTER — Ambulatory Visit (INDEPENDENT_AMBULATORY_CARE_PROVIDER_SITE_OTHER): Payer: Self-pay | Admitting: Neurology

## 2015-01-19 DIAGNOSIS — G609 Hereditary and idiopathic neuropathy, unspecified: Secondary | ICD-10-CM

## 2015-01-19 DIAGNOSIS — R202 Paresthesia of skin: Secondary | ICD-10-CM

## 2015-01-19 NOTE — Progress Notes (Signed)
See procedure note.

## 2015-01-19 NOTE — Progress Notes (Signed)
  [facility]    Provider:  Dr Jaynee Eagles Referring Provider: Melvenia Beam, MD Primary Care Physician:  Gavin Pound, MD   History: Autumn Hicks is a 54 y.o. female here as a referral from Dr. Melony Overly for Neuritis She has severe pain after plantar fasciitis repair. Lyrica is helping.  She had a popliteal block during the procedure and unsure if that has caused her pain. It is in the left foot. She has swelling in the top of the left foot. The pain feels like someone is drilling through her foot. Numb and tingly in the great toe. She can't wear any kind of shoe due to pain. From the moment she woke up from her surgery she has felt this pain. She was placed in a cam walker boot but the pain started even before that. She had a popliteal block for the surgery. No LBP. Has generalized weakness as well but denies focal weakness in the left leg/foot. No foot drop. Just generalized weakness. Has tingling and pain in the left foot, the bottom and dorsum. Sleeping is uncomfortable. Can't touch the foot. Whole bottom of the foot painful as well. Neuro exam significant for decreased sensation as well as allodynia and hyperalgesia in the dorsum and bottom of the right foot, extending to right above the ankle   Summary: Nerve conduction studies were performed on the bilateral lower extremities:  Left Peroneal and Left Tibial motor conductions(recording from both the Extensor Digitorum Brevis and Tibialis Anterior) were within normal limits with normal F Wave latencies.  Left Medial and left Lateral Plantar mixed nerves were within normal limits Left Sural and Peroneal sensory conductions were within normal limits Bilateral H Reflex latencies were within normal limits.    EMG needle study was performed on selected left-sided muscles. The Biceps Femoris(both short and long heads), Gluteus Maximus, Vastus Medialis, Anterior Tibialis, Peroneus Longus, Medial Gastrocnemius, Extensor Hallucis  Longus, Abductor Hallucis Brevis, Abductor Digiti Quinti Pedis muscles and L4/L5/S1 paraspinal muscles were within normal limits.  Conclusion:  Normal study. No evidence for Tarsal Tunnel syndrome, Peroneal neuropathy,Tibial or Sciatic neuropathy, Lumbosacral plexopathy, Radiculopathy or polyneuropathy.   Sarina Ill, MD  [facility] [address] [place], [place] [address]  Phone [phone] Fax [phone]

## 2015-01-19 NOTE — Procedures (Signed)
  [facility]    Provider:  Dr Jaynee Eagles Referring Provider: Melvenia Beam, MD Primary Care Physician:  Gavin Pound, MD   History: Autumn Hicks is a 54 y.o. female here as a referral from Dr. Melony Overly for Neuritis She has severe pain after plantar fasciitis repair. Lyrica is helping.  She had a popliteal block during the procedure and unsure if that has caused her pain. It is in the left foot. She has swelling in the top of the left foot. The pain feels like someone is drilling through her foot. Numb and tingly in the great toe. She can't wear any kind of shoe due to pain. From the moment she woke up from her surgery she has felt this pain. She was placed in a cam walker boot but the pain started even before that. She had a popliteal block for the surgery. No LBP. Has generalized weakness as well but denies focal weakness in the left leg/foot. No foot drop. Just generalized weakness. Has tingling and pain in the left foot, the bottom and dorsum. Sleeping is uncomfortable. Can't touch the foot. Whole bottom of the foot painful as well. Neuro exam significant for decreased sensation as well as allodynia and hyperalgesia in the dorsum and bottom of the right foot, extending to right above the ankle   Summary: Nerve conduction studies were performed on the bilateral lower extremities:  Left Peroneal and Left Tibial motor conductions(recording from both the Extensor Digitorum Brevis and Tibialis Anterior) were within normal limits with normal F Wave latencies.  Left Medial and left Lateral Plantar mixed nerves were within normal limits Left Sural and Peroneal sensory conductions were within normal limits Bilateral H Reflex latencies were within normal limits.    EMG needle study was performed on selected left-sided muscles. The Biceps Femoris(both short and long heads), Gluteus Maximus, Vastus Medialis, Anterior Tibialis, Peroneus Longus, Medial Gastrocnemius, Extensor Hallucis  Longus, Abductor Hallucis Brevis, Abductor Digiti Quinti Pedis muscles and L4/L5/S1 paraspinal muscles were within normal limits.  Conclusion:  Normal study. No evidence for Tarsal Tunnel syndrome, Peroneal neuropathy,Tibial or Sciatic neuropathy, Lumbosacral plexopathy, Radiculopathy or polyneuropathy.   Sarina Ill, MD  [facility] [address] [place], [place] [address]  Phone [phone] Fax [phone]

## 2015-04-05 ENCOUNTER — Encounter: Payer: Self-pay | Admitting: Emergency Medicine

## 2015-04-05 ENCOUNTER — Emergency Department: Payer: 59

## 2015-04-05 ENCOUNTER — Inpatient Hospital Stay: Admit: 2015-04-05 | Payer: Self-pay

## 2015-04-05 ENCOUNTER — Emergency Department
Admission: EM | Admit: 2015-04-05 | Discharge: 2015-04-05 | Disposition: A | Discharge status: Home / Self Care | Payer: 59 | Attending: Emergency Medicine | Admitting: Emergency Medicine

## 2015-04-05 DIAGNOSIS — H9203 Otalgia, bilateral: Secondary | ICD-10-CM | POA: Diagnosis present

## 2015-04-05 DIAGNOSIS — Z87891 Personal history of nicotine dependence: Secondary | ICD-10-CM | POA: Insufficient documentation

## 2015-04-05 DIAGNOSIS — R059 Cough, unspecified: Secondary | ICD-10-CM

## 2015-04-05 DIAGNOSIS — H6503 Acute serous otitis media, bilateral: Secondary | ICD-10-CM | POA: Diagnosis not present

## 2015-04-05 DIAGNOSIS — J302 Other seasonal allergic rhinitis: Secondary | ICD-10-CM

## 2015-04-05 DIAGNOSIS — R05 Cough: Secondary | ICD-10-CM

## 2015-04-05 IMAGING — CR DG CHEST 2V
1 series · 2 of 2 positions shown · non-contrast
Comparison: None.

CLINICAL DATA: Cough, 6 days duration.

EXAM:
CHEST  2 VIEW

[Series 1: dg chest 2 view · 0.14mm/px · 2 of 2 slices shown]
[im 1/2]
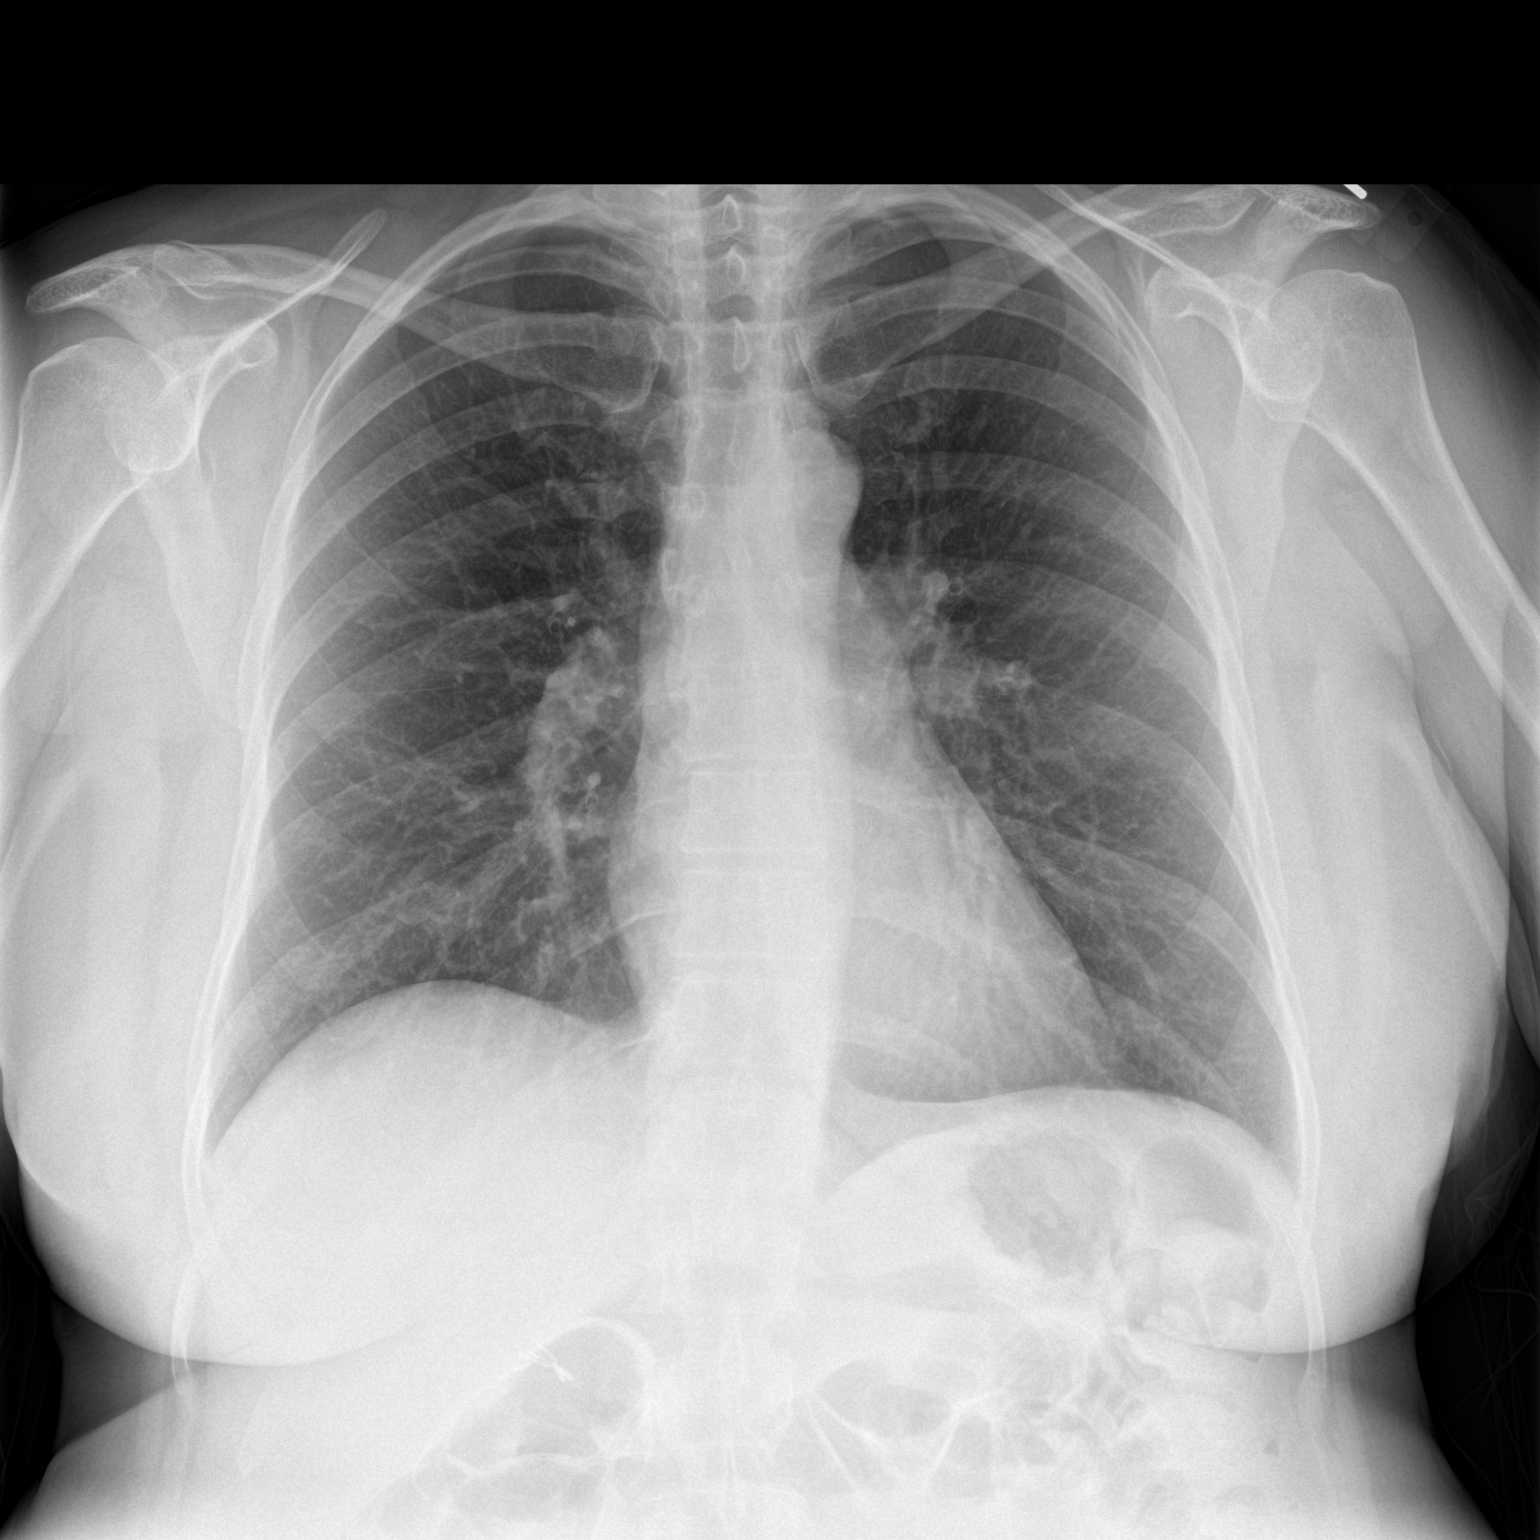
[im 2/2]
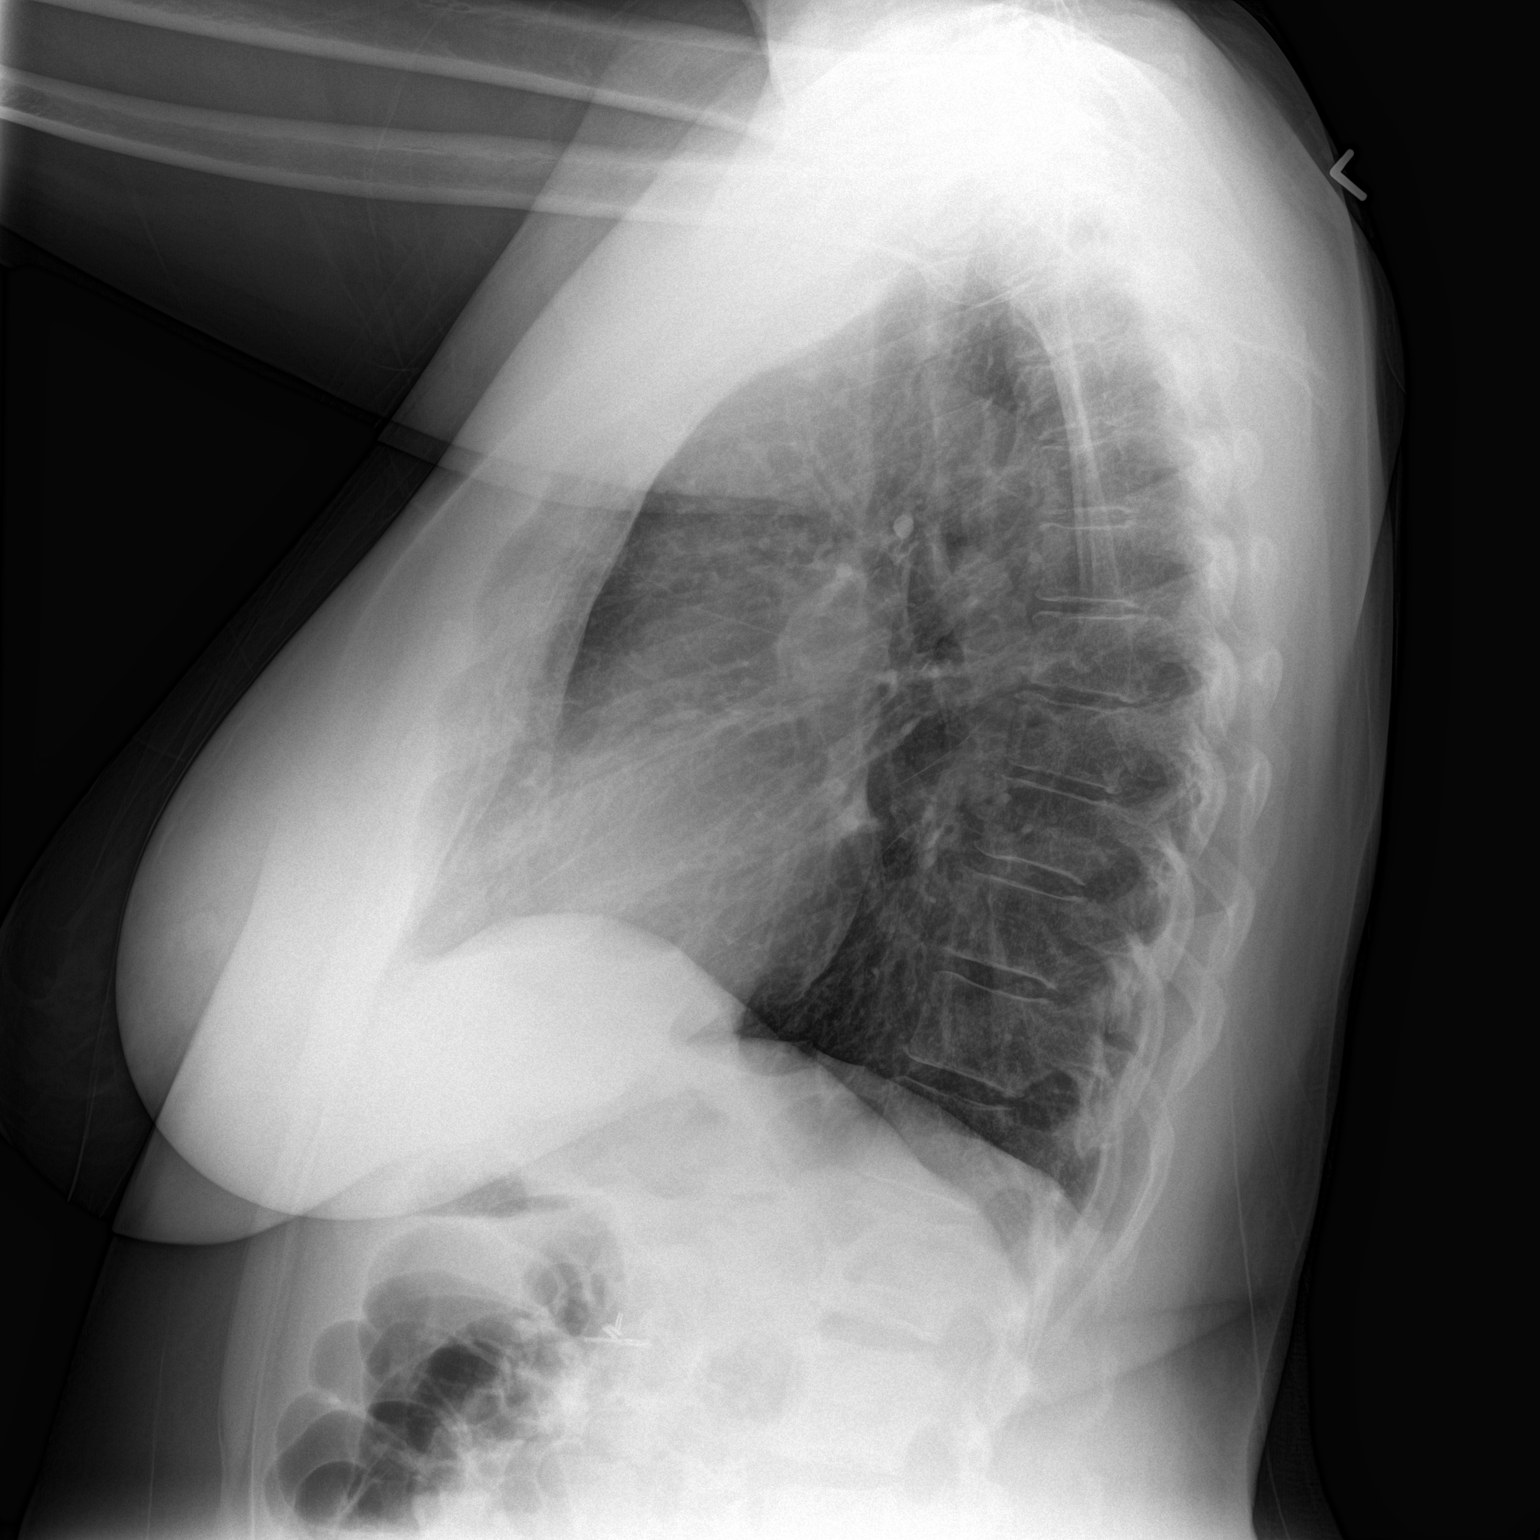

[2 of 2 positions shown; findings below may reference images not displayed]

FINDINGS: Heart size is normal. Mediastinal shadows are normal. There is
central bronchial thickening. No infiltrate, collapse or effusion.
No bony abnormality.
IMPRESSION: Bronchitis.  No consolidation or collapse.

## 2015-04-05 MED ORDER — DEXAMETHASONE SODIUM PHOSPHATE 10 MG/ML IJ SOLN
10.0000 mg | Freq: Once | INTRAMUSCULAR | Status: AC
Start: 2015-04-05 — End: 2015-04-05
  Administered 2015-04-05: 10 mg via INTRAMUSCULAR

## 2015-04-05 MED ORDER — BENZONATATE 100 MG PO CAPS
200.0000 mg | ORAL_CAPSULE | Freq: Once | ORAL | Status: AC
  Administered 2015-04-05: 200 mg via ORAL

## 2015-04-05 MED ORDER — BENZONATATE 100 MG PO CAPS
ORAL_CAPSULE | ORAL | Status: AC
  Administered 2015-04-05: 200 mg via ORAL
  Filled 2015-04-05: qty 2

## 2015-04-05 MED ORDER — DEXAMETHASONE SODIUM PHOSPHATE 10 MG/ML IJ SOLN
INTRAMUSCULAR | Status: AC
  Administered 2015-04-05: 10 mg via INTRAMUSCULAR
  Filled 2015-04-05: qty 1

## 2015-04-05 MED ORDER — FLUTICASONE PROPIONATE 50 MCG/ACT NA SUSP: 1.0000 | Freq: Every day | NASAL | Status: AC

## 2015-04-05 MED ORDER — BENZONATATE 100 MG PO CAPS: 100.0000 mg | ORAL_CAPSULE | Freq: Three times a day (TID) | ORAL | Status: DC | PRN

## 2015-04-05 NOTE — ED Provider Notes (Signed)
[facility] Emergency Department Provider Note? ____________________________________________ ? Time seen: 8:25 PM on 04/05/2015 -----------------------------------------  I have reviewed the triage vital signs and the nursing notes.  ________ HISTORY ? Chief Complaint Otalgia   HPI  Autumn Hicks is a 54 y.o. female with 1 week c/o URI symptoms including cough, sore throat, hoarseness, and chest discomfort at night.  She noted bilateral earache today, with dullness to hearing, and intermittent sharp pains.  She denies NVD, FCS, or vertigo. She has dosed multiple OTC meds without significant or lasting results.    Review of Systems  Constitutional: Negative for fever. Eyes: Negative for visual changes. ENT: Positive for sore throat, otalgia Cardiovascular: Negative for chest pain. Respiratory: Negative for shortness of breath. Gastrointestinal: Negative for abdominal pain, vomiting and diarrhea. Musculoskeletal: Negative for back pain. Skin: Negative for rash. Neurological: Negative for headaches, focal weakness or numbness.  10-point ROS otherwise negative. ____________________________________________  Past Medical History  Diagnosis Date  . History of fasciotomy     topaz fasciotomy   . Migraine   . Depression   . Anxiety     Patient Active Problem List   Diagnosis Date Noted  . Cough 04/05/2015  . Plantar fasciitis 01/13/2015  . Hereditary and idiopathic peripheral neuropathy 01/13/2015   ? Past Surgical History  Procedure Laterality Date  . Abdominal hysterectomy    . Hernia repair    . Cesarean section    . Tubal ligation    . Breast lumpectomy Bilateral   . Left wrist    ? Allergies Sulfa antibiotics ? Family History  Problem Relation Age of Onset  . Breast cancer    ? Social History History  Substance Use Topics  . Smoking status: Former Smoker    Quit date: 11/29/2011  . Smokeless tobacco: Not on file  . Alcohol Use:  0.6 oz/week    1 Glasses of wine per week    PHYSICAL EXAM:  VITAL SIGNS: ED Triage Vitals  Enc Vitals Group     BP 04/05/15 1859 140/85 mmHg     Pulse Rate 04/05/15 1859 87     Resp 04/05/15 1859 18     Temp 04/05/15 1859 97.7 F (36.5 C)     Temp Source 04/05/15 1859 Oral     SpO2 04/05/15 1859 97 %     Weight 04/05/15 1859 190 lb (86.183 kg)     Height 04/05/15 1859 5\' 7"  (1.702 m)     Head Cir --      Peak Flow --      Pain Score 04/05/15 1859 8     Pain Loc --      Pain Edu? --      Excl. in [place]? --     Constitutional: Alert and oriented. Well appearing and in no distress. Eyes: Conjunctivae are normal. PERRL. Normal extraocular movements. ENT   Head: Normocephalic and atraumatic.   Nose: Sinus congestion clear rhinnorhea.   Mouth/Throat: Mucous membranes are moist.      Ears: Normal external exam. Canals clear. TMs retracted bilaterally, with serous fluid noted.    Neck: Supple. No lymphadenopathy. Cardiovascular: Normal rate, regular rhythm. No murmurs, rubs, or gallops. Respiratory: Normal respiratory effort without tachypnea nor retractions. Breath sounds are clear and equal bilaterally. No wheezes/rales/rhonchi. Musculoskeletal: Normal range of motion in all extremities.  Neurologic:  Normal speech and language. No gross focal neurologic deficits are appreciated.  Skin:  Skin is warm, dry and intact. No rash noted. Psychiatric:  Mood and affect are normal. Patient exhibits appropriate insight and judgment. ______________ PROCEDURES ? Procedure(s) performed: None  Critical Care performed: None ______________________________________________________ INITIAL IMPRESSION / ASSESSMENT AND PLAN / ED COURSE ? Pertinent labs & imaging results that were available during my care of the patient were reviewed by me and considered in my medical decision making (see chart for details).  ___________________________________________ FINAL CLINICAL IMPRESSION(S) /  ED DIAGNOSES?  Final diagnoses:  Other seasonal allergic rhinitis  Acute serous otitis media of both ears without rupture      Melvenia Needles, PA-C 04/05/15 2053  Lavonia Drafts, MD 04/05/15 2314

## 2015-04-05 NOTE — Discharge Instructions (Signed)
Allergic Rhinitis Allergic rhinitis is when the mucous membranes in the nose respond to allergens. Allergens are particles in the air that cause your body to have an allergic reaction. This causes you to release allergic antibodies. Through a chain of events, these eventually cause you to release histamine into the blood stream. Although meant to protect the body, it is this release of histamine that causes your discomfort, such as frequent sneezing, congestion, and an itchy, runny nose.  CAUSES  Seasonal allergic rhinitis (hay fever) is caused by pollen allergens that may come from grasses, trees, and weeds. Year-round allergic rhinitis (perennial allergic rhinitis) is caused by allergens such as house dust mites, pet dander, and mold spores.  SYMPTOMS   Nasal stuffiness (congestion).  Itchy, runny nose with sneezing and tearing of the eyes. DIAGNOSIS  Your health care provider can help you determine the allergen or allergens that trigger your symptoms. If you and your health care provider are unable to determine the allergen, skin or blood testing may be used. TREATMENT  Allergic rhinitis does not have a cure, but it can be controlled by:  Medicines and allergy shots (immunotherapy).  Avoiding the allergen. Hay fever may often be treated with antihistamines in pill or nasal spray forms. Antihistamines block the effects of histamine. There are over-the-counter medicines that may help with nasal congestion and swelling around the eyes. Check with your health care provider before taking or giving this medicine.  If avoiding the allergen or the medicine prescribed do not work, there are many new medicines your health care provider can prescribe. Stronger medicine may be used if initial measures are ineffective. Desensitizing injections can be used if medicine and avoidance does not work. Desensitization is when a patient is given ongoing shots until the body becomes less sensitive to the allergen.  Make sure you follow up with your health care provider if problems continue. HOME CARE INSTRUCTIONS It is not possible to completely avoid allergens, but you can reduce your symptoms by taking steps to limit your exposure to them. It helps to know exactly what you are allergic to so that you can avoid your specific triggers. SEEK MEDICAL CARE IF:   You have a fever.  You develop a cough that does not stop easily (persistent).  You have shortness of breath.  You start wheezing.  Symptoms interfere with normal daily activities. Document Released: 08/09/2001 Document Revised: 11/19/2013 Document Reviewed: 07/22/2013 [facility] Patient Information 2015 [place], [place]. This information is not intended to replace advice given to you by your health care provider. Make sure you discuss any questions you have with your health care provider.  Serous Otitis Media Serous otitis media is fluid in the middle ear space. This space contains the bones for hearing and air. Air in the middle ear space helps to transmit sound.  The air gets there through the eustachian tube. This tube goes from the back of the nose (nasopharynx) to the middle ear space. It keeps the pressure in the middle ear the same as the outside world. It also helps to drain fluid from the middle ear space. CAUSES  Serous otitis media occurs when the eustachian tube gets blocked. Blockage can come from:  Ear infections.  Colds and other upper respiratory infections.  Allergies.  Irritants such as cigarette smoke.  Sudden changes in air pressure (such as descending in an airplane).  Enlarged adenoids.  A mass in the nasopharynx. During colds and upper respiratory infections, the middle ear space can become  temporarily filled with fluid. This can happen after an ear infection also. Once the infection clears, the fluid will generally drain out of the ear through the eustachian tube. If it does not, then serous otitis media  occurs. SIGNS AND SYMPTOMS   Hearing loss.  A feeling of fullness in the ear, without pain.  Young children may not show any symptoms but may show slight behavioral changes, such as agitation, ear pulling, or crying. DIAGNOSIS  Serous otitis media is diagnosed by an ear exam. Tests may be done to check on the movement of the eardrum. Hearing exams may also be done. TREATMENT  The fluid most often goes away without treatment. If allergy is the cause, allergy treatment may be helpful. Fluid that persists for several months may require minor surgery. A small tube is placed in the eardrum to:  Drain the fluid.  Restore the air in the middle ear space. In certain situations, antibiotic medicines are used to avoid surgery. Surgery may be done to remove enlarged adenoids (if this is the cause). HOME CARE INSTRUCTIONS   Keep children away from tobacco smoke.  Keep all follow-up visits as directed by your health care provider. SEEK MEDICAL CARE IF:   Your hearing is not better in 3 months.  Your hearing is worse.  You have ear pain.  You have drainage from the ear.  You have dizziness.  You have serous otitis media only in one ear or have any bleeding from your nose (epistaxis).  You notice a lump on your neck. MAKE SURE YOU:  Understand these instructions.   Will watch your condition.   Will get help right away if you are not doing well or get worse.  Document Released: 02/04/2004 Document Revised: 03/31/2014 Document Reviewed: 06/11/2013 [facility] Patient Information 2015 [address], [place]. This information is not intended to replace advice given to you by your health care provider. Make sure you discuss any questions you have with your health care provider.

## 2015-04-05 NOTE — ED Notes (Signed)
Patient reports having cold recently. Now with hoarse voice. Developed bilateral earache today.

## 2015-04-13 ENCOUNTER — Ambulatory Visit: Payer: 59 | Admitting: Medical

## 2015-05-22 ENCOUNTER — Ambulatory Visit: Payer: 59 | Admitting: Physical Medicine & Rehabilitation

## 2015-10-26 ENCOUNTER — Emergency Department (HOSPITAL_COMMUNITY)
Admission: EM | Admit: 2015-10-26 | Discharge: 2015-10-26 | Disposition: A | Discharge status: Home / Self Care | Payer: 59 | Attending: Emergency Medicine | Admitting: Emergency Medicine

## 2015-10-26 ENCOUNTER — Emergency Department (HOSPITAL_COMMUNITY): Payer: 59

## 2015-10-26 ENCOUNTER — Encounter (HOSPITAL_COMMUNITY): Payer: Self-pay | Admitting: Family Medicine

## 2015-10-26 DIAGNOSIS — S20212A Contusion of left front wall of thorax, initial encounter: Secondary | ICD-10-CM | POA: Insufficient documentation

## 2015-10-26 DIAGNOSIS — Y9289 Other specified places as the place of occurrence of the external cause: Secondary | ICD-10-CM | POA: Insufficient documentation

## 2015-10-26 DIAGNOSIS — Y998 Other external cause status: Secondary | ICD-10-CM | POA: Diagnosis not present

## 2015-10-26 DIAGNOSIS — Z8679 Personal history of other diseases of the circulatory system: Secondary | ICD-10-CM | POA: Diagnosis not present

## 2015-10-26 DIAGNOSIS — Z79899 Other long term (current) drug therapy: Secondary | ICD-10-CM | POA: Insufficient documentation

## 2015-10-26 DIAGNOSIS — Z87891 Personal history of nicotine dependence: Secondary | ICD-10-CM | POA: Diagnosis not present

## 2015-10-26 DIAGNOSIS — S20211A Contusion of right front wall of thorax, initial encounter: Secondary | ICD-10-CM | POA: Diagnosis not present

## 2015-10-26 DIAGNOSIS — F419 Anxiety disorder, unspecified: Secondary | ICD-10-CM | POA: Diagnosis not present

## 2015-10-26 DIAGNOSIS — J988 Other specified respiratory disorders: Secondary | ICD-10-CM | POA: Diagnosis not present

## 2015-10-26 DIAGNOSIS — X58XXXA Exposure to other specified factors, initial encounter: Secondary | ICD-10-CM | POA: Insufficient documentation

## 2015-10-26 DIAGNOSIS — F329 Major depressive disorder, single episode, unspecified: Secondary | ICD-10-CM | POA: Diagnosis not present

## 2015-10-26 DIAGNOSIS — Z7951 Long term (current) use of inhaled steroids: Secondary | ICD-10-CM | POA: Diagnosis not present

## 2015-10-26 DIAGNOSIS — J029 Acute pharyngitis, unspecified: Secondary | ICD-10-CM | POA: Diagnosis present

## 2015-10-26 DIAGNOSIS — Y9389 Activity, other specified: Secondary | ICD-10-CM | POA: Diagnosis not present

## 2015-10-26 DIAGNOSIS — S2020XA Contusion of thorax, unspecified, initial encounter: Secondary | ICD-10-CM

## 2015-10-26 LAB — CBC WITH DIFFERENTIAL/PLATELET
Basophils Absolute: 0 10*3/uL (ref 0.0–0.1)
Basophils Relative: 1 %
Eosinophils Absolute: 0.2 10*3/uL (ref 0.0–0.7)
Eosinophils Relative: 2 %
HCT: 41.8 % (ref 36.0–46.0)
Hemoglobin: 14 g/dL (ref 12.0–15.0)
Lymphocytes Relative: 20 %
Lymphs Abs: 1.3 10*3/uL (ref 0.7–4.0)
MCH: 31.5 pg (ref 26.0–34.0)
MCHC: 33.5 g/dL (ref 30.0–36.0)
MCV: 94.1 fL (ref 78.0–100.0)
Monocytes Absolute: 0.6 10*3/uL (ref 0.1–1.0)
Monocytes Relative: 10 %
Neutro Abs: 4.4 10*3/uL (ref 1.7–7.7)
Neutrophils Relative %: 67 %
Platelets: 226 10*3/uL (ref 150–400)
RBC: 4.44 MIL/uL (ref 3.87–5.11)
RDW: 13 % (ref 11.5–15.5)
WBC: 6.6 10*3/uL (ref 4.0–10.5)

## 2015-10-26 LAB — URINALYSIS, ROUTINE W REFLEX MICROSCOPIC
Bilirubin Urine: NEGATIVE
Glucose, UA: NEGATIVE mg/dL
Hgb urine dipstick: NEGATIVE
Ketones, ur: NEGATIVE mg/dL
Leukocytes, UA: NEGATIVE
Nitrite: NEGATIVE
Protein, ur: NEGATIVE mg/dL
Specific Gravity, Urine: 1.015 (ref 1.005–1.030)
pH: 5.5 (ref 5.0–8.0)

## 2015-10-26 LAB — COMPREHENSIVE METABOLIC PANEL
ALT: 29 U/L (ref 14–54)
AST: 24 U/L (ref 15–41)
Albumin: 4.2 g/dL (ref 3.5–5.0)
Alkaline Phosphatase: 55 U/L (ref 38–126)
Anion gap: 9 (ref 5–15)
BUN: 13 mg/dL (ref 6–20)
CO2: 24 mmol/L (ref 22–32)
Calcium: 9.4 mg/dL (ref 8.9–10.3)
Chloride: 104 mmol/L (ref 101–111)
Creatinine, Ser: 0.78 mg/dL (ref 0.44–1.00)
GFR calc Af Amer: >60 mL/min (ref >60)
GFR calc non Af Amer: >60 mL/min (ref >60)
Glucose, Bld: 105 mg/dL — ABNORMAL HIGH (ref 65–99)
Potassium: 3.6 mmol/L (ref 3.5–5.1)
Sodium: 137 mmol/L (ref 135–145)
Total Bilirubin: 0.7 mg/dL (ref 0.3–1.2)
Total Protein: 7 g/dL (ref 6.5–8.1)

## 2015-10-26 LAB — RAPID STREP SCREEN (MED CTR MEBANE ONLY): Streptococcus, Group A Screen (Direct): NEGATIVE

## 2015-10-26 IMAGING — CR DG CHEST 2V
2 series · 2 of 2 positions shown · non-contrast
Comparison: None.

CLINICAL DATA: Dyspnea and chest pain for 2 weeks.

EXAM:
CHEST  2 VIEW

[w chest pa]
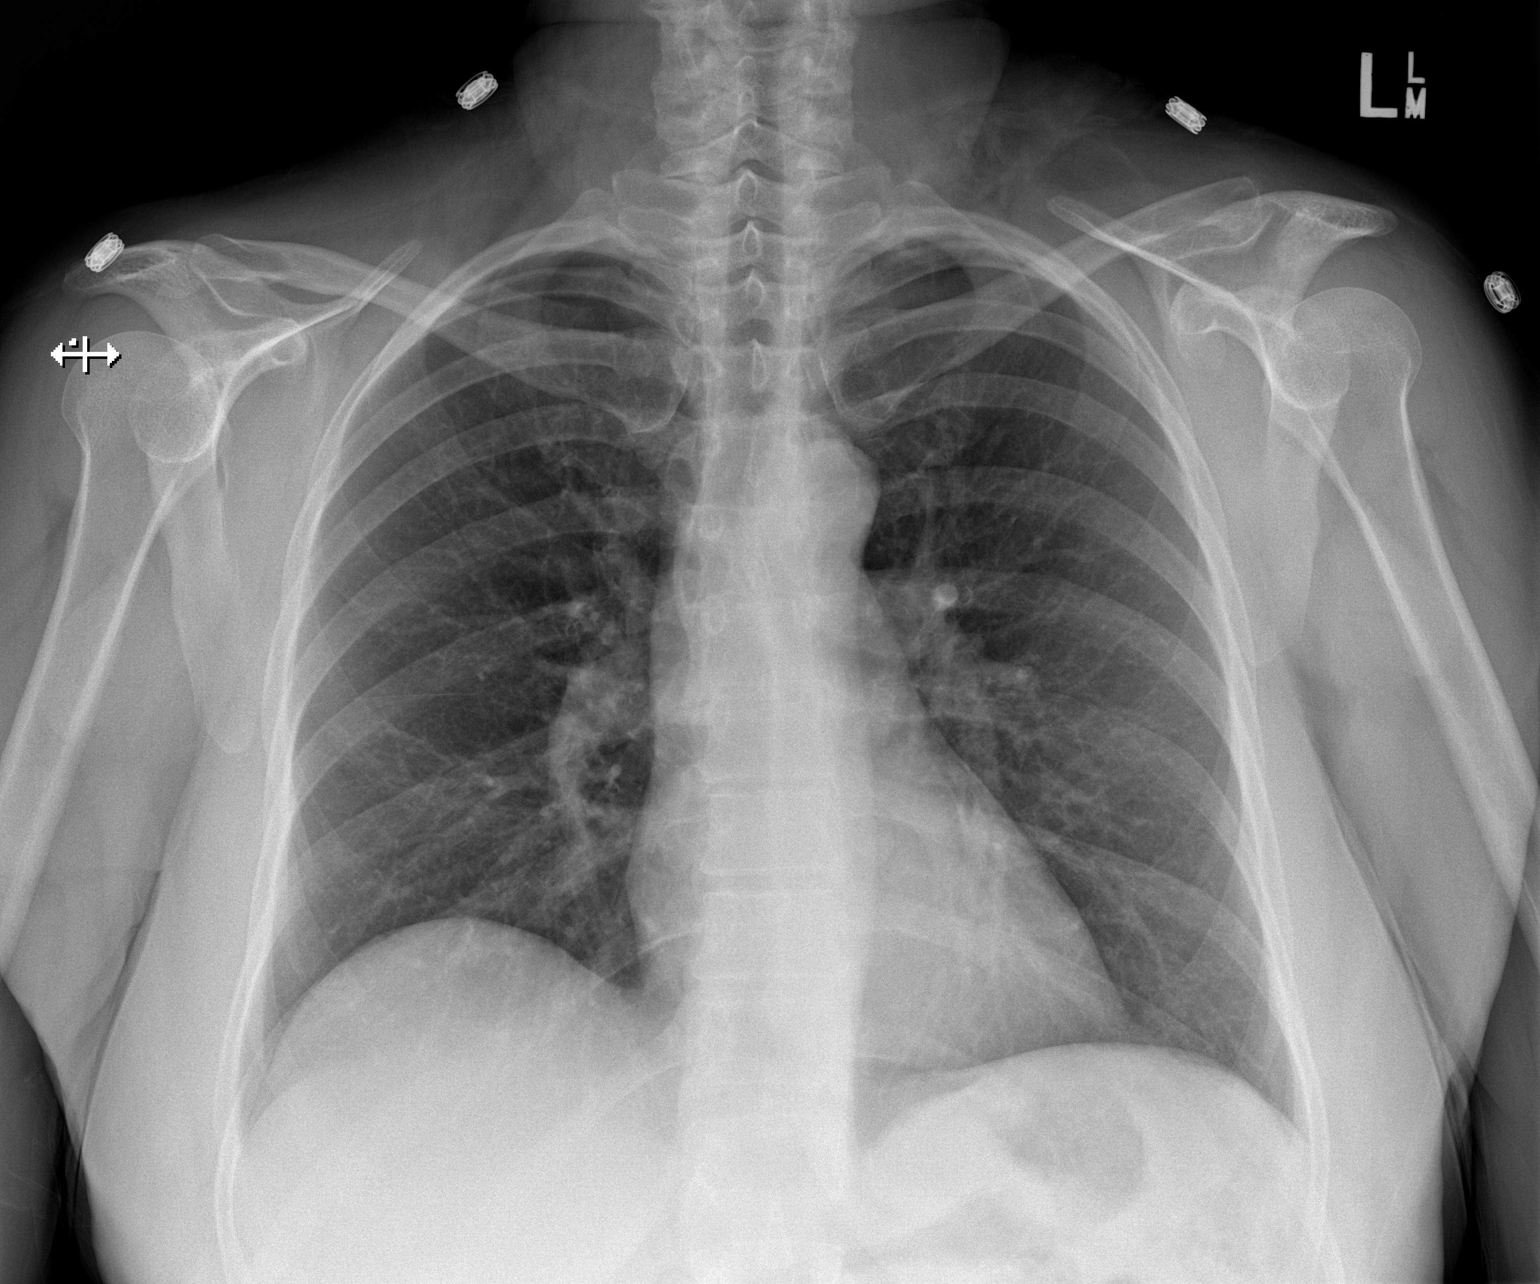

[w chest lat]
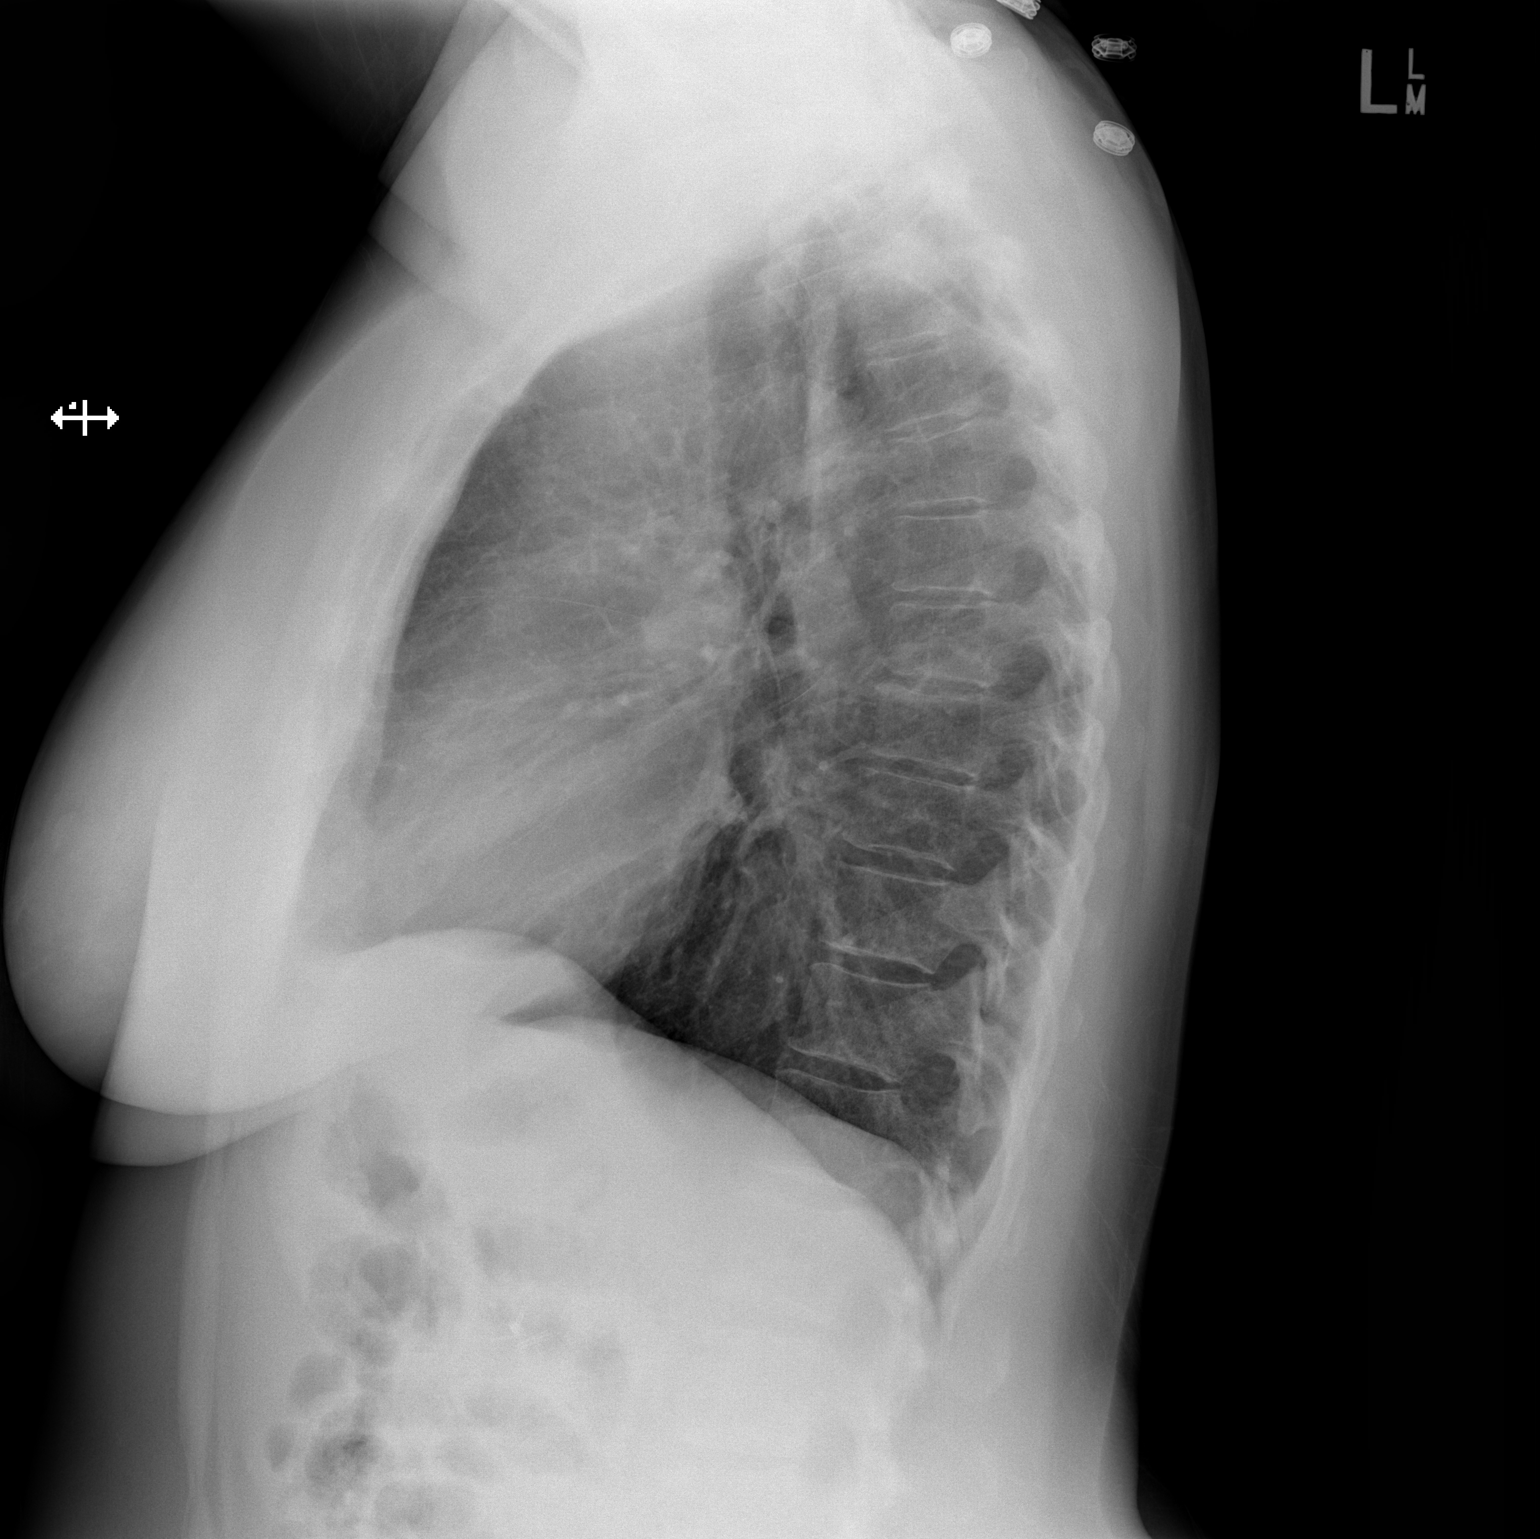

[2 of 2 positions shown; findings below may reference images not displayed]

FINDINGS: The heart size and mediastinal contours are within normal limits.
Both lungs are clear. The visualized skeletal structures are
unremarkable.
IMPRESSION: No active cardiopulmonary disease.

## 2015-10-26 MED ORDER — PREDNISONE 50 MG PO TABS: 50.0000 mg | ORAL_TABLET | Freq: Every day | ORAL | Status: DC

## 2015-10-26 MED ORDER — DOXYCYCLINE HYCLATE 100 MG PO CAPS: 100.0000 mg | ORAL_CAPSULE | Freq: Two times a day (BID) | ORAL | Status: AC

## 2015-10-26 MED ORDER — PREDNISONE 20 MG PO TABS
60.0000 mg | ORAL_TABLET | Freq: Once | ORAL | Status: AC
  Administered 2015-10-26: 60 mg via ORAL
  Filled 2015-10-26: qty 3

## 2015-10-26 MED ORDER — DOXYCYCLINE HYCLATE 100 MG PO TABS
100.0000 mg | ORAL_TABLET | Freq: Once | ORAL | Status: AC
  Administered 2015-10-26: 100 mg via ORAL
  Filled 2015-10-26: qty 1

## 2015-10-26 MED ORDER — ALBUTEROL SULFATE HFA 108 (90 BASE) MCG/ACT IN AERS: 2.0000 | INHALATION_SPRAY | RESPIRATORY_TRACT | Status: DC | PRN

## 2015-10-26 MED ORDER — IPRATROPIUM-ALBUTEROL 0.5-2.5 (3) MG/3ML IN SOLN
3.0000 mL | Freq: Once | RESPIRATORY_TRACT | Status: AC
  Administered 2015-10-26: 3 mL via RESPIRATORY_TRACT
  Filled 2015-10-26: qty 3

## 2015-10-26 NOTE — ED Notes (Signed)
Patient has purple/yellowish colored skin on the left and right side of the breast. Patient states it is painful to touch. Patient requested to urinate. Patient taken to the restroom.

## 2015-10-26 NOTE — ED Notes (Signed)
Urine sent to lab 

## 2015-10-26 NOTE — ED Provider Notes (Signed)
CSN: [id number]     Arrival date & time 10/26/15  0522 History   First MD Initiated Contact with Patient 10/26/15 0541     Chief Complaint  Patient presents with  . Bleeding/Bruising  . Influenza     (Consider location/radiation/quality/duration/timing/severity/associated sxs/prior Treatment) Patient is a 54 y.o. female presenting with flu symptoms. The history is provided by the patient.  Influenza She has been sick for approximately a month. She's complaining of sore throat, nasal congestion, cough productive of green sputum. She has had some sweats but has not had any documented fevers. She noticed bruising on both sides of her chest was 4 days ago and the only trauma was from coughing. Appetite is been somewhat diminished. She has had voluntary weight loss of about 25 pounds over the last 6 months. She also states that she pulled a tick off of her proximal left thigh approximately 5 months ago and had areas still bruised. She is concerned about possible Lyme disease.  Past Medical History  Diagnosis Date  . History of fasciotomy     topaz fasciotomy   . Migraine   . Depression   . Anxiety    Past Surgical History  Procedure Laterality Date  . Abdominal hysterectomy    . Hernia repair    . Cesarean section    . Tubal ligation    . Breast lumpectomy Bilateral   . Left wrist     Family History  Problem Relation Age of Onset  . Breast cancer     Social History  Substance Use Topics  . Smoking status: Former Smoker    Quit date: 11/29/2011  . Smokeless tobacco: None  . Alcohol Use: Yes     Comment: Three times a week.    OB History    No data available     Review of Systems  All other systems reviewed and are negative.     Allergies  Sulfa antibiotics  Home Medications   Prior to Admission medications   Medication Sig Start Date End Date Taking? Authorizing Provider  albuterol (PROVENTIL HFA;VENTOLIN HFA) 108 (90 BASE) MCG/ACT inhaler Inhale into the lungs  every 6 (six) hours as needed for wheezing or shortness of breath.   Yes Historical Provider, MD  cetirizine (ZYRTEC) 10 MG tablet Take 1 tablet (10 mg total) by mouth daily. One tab daily for allergies 02/05/13  Yes Billy Fischer, MD  cholecalciferol (VITAMIN D) 1000 UNITS tablet Take 2,000 Units by mouth daily.   Yes Historical Provider, MD  cyanocobalamin (,VITAMIN B-12,) 1000 MCG/ML injection Inject 1,000 mcg into the muscle every 14 (fourteen) days.   Yes Historical Provider, MD  DULoxetine (CYMBALTA) 30 MG capsule Take 30 mg by mouth 2 (two) times daily.   Yes Historical Provider, MD  Esomeprazole Magnesium (NEXIUM PO) Take 22.3 mg by mouth daily.    Yes Historical Provider, MD  fluticasone (FLONASE) 50 MCG/ACT nasal spray Place 1 spray into both nostrils daily. 04/05/15  Yes Jenise V Bacon Menshew, PA-C  montelukast (SINGULAIR) 10 MG tablet Take 10 mg by mouth daily. 10/19/15  Yes Historical Provider, MD  phentermine (ADIPEX-P) 37.5 MG tablet Take 37.5 mg by mouth daily. 09/26/15  Yes Historical Provider, MD   BP 126/89 mmHg  Pulse 100  Temp(Src) 97.9 F (36.6 C) (Oral)  Resp 20  Ht 5\' 7"  (1.702 m)  Wt 173 lb (78.472 kg)  BMI 27.09 kg/m2  SpO2 100% Physical Exam  Nursing note and vitals reviewed.  [place]  year old female, resting comfortably and in no acute distress. Vital signs are normal. Oxygen saturation is 100%, which is normal. Head is normocephalic and atraumatic. PERRLA, EOMI. Oropharynx is mildly erythematous without exudate. There is tenderness palpation over frontal and maxillary sinuses. Turbinates are mildly edematous but without significant drainage. Neck is nontender and supple without adenopathy or JVD. Back is nontender and there is no CVA tenderness. Lungs are clear without rales, wheezes, or rhonchi. Chest is tender along the lateral chest wall bilaterally. Ecchymosis is present but does significantly worse on the left. Heart has regular rate and rhythm without  murmur. Abdomen is soft, flat, nontender without masses or hepatosplenomegaly and peristalsis is normoactive. Extremities have no cyanosis or edema, full range of motion is present. Skin is warm and dry without rash. Specifically, no petechiae are seen. Neurologic: Mental status is normal, cranial nerves are intact, there are no motor or sensory deficits.  ED Course  Procedures (including critical care time) Labs Review Results for orders placed or performed during the hospital encounter of 10/26/15  Rapid strep screen (not at [facility])  Result Value Ref Range   Streptococcus, Group A Screen (Direct) NEGATIVE NEGATIVE  Comprehensive metabolic panel  Result Value Ref Range   Sodium 137 135 - 145 mmol/L   Potassium 3.6 3.5 - 5.1 mmol/L   Chloride 104 101 - 111 mmol/L   CO2 24 22 - 32 mmol/L   Glucose, Bld 105 (H) 65 - 99 mg/dL   BUN 13 6 - 20 mg/dL   Creatinine, Ser 0.78 0.44 - 1.00 mg/dL   Calcium 9.4 8.9 - 10.3 mg/dL   Total Protein 7.0 6.5 - 8.1 g/dL   Albumin 4.2 3.5 - 5.0 g/dL   AST 24 15 - 41 U/L   ALT 29 14 - 54 U/L   Alkaline Phosphatase 55 38 - 126 U/L   Total Bilirubin 0.7 0.3 - 1.2 mg/dL   GFR calc non Af Amer >60 >60 mL/min   GFR calc Af Amer >60 >60 mL/min   Anion gap 9 5 - 15  CBC with Differential  Result Value Ref Range   WBC 6.6 4.0 - 10.5 K/uL   RBC 4.44 3.87 - 5.11 MIL/uL   Hemoglobin 14.0 12.0 - 15.0 g/dL   HCT 41.8 36.0 - 46.0 %   MCV 94.1 78.0 - 100.0 fL   MCH 31.5 26.0 - 34.0 pg   MCHC 33.5 30.0 - 36.0 g/dL   RDW 13.0 11.5 - 15.5 %   Platelets 226 150 - 400 K/uL   Neutrophils Relative % 67 %   Neutro Abs 4.4 1.7 - 7.7 K/uL   Lymphocytes Relative 20 %   Lymphs Abs 1.3 0.7 - 4.0 K/uL   Monocytes Relative 10 %   Monocytes Absolute 0.6 0.1 - 1.0 K/uL   Eosinophils Relative 2 %   Eosinophils Absolute 0.2 0.0 - 0.7 K/uL   Basophils Relative 1 %   Basophils Absolute 0.0 0.0 - 0.1 K/uL  Urinalysis, Routine w reflex microscopic  Result Value Ref Range    Color, Urine YELLOW YELLOW   APPearance CLOUDY (A) CLEAR   Specific Gravity, Urine 1.015 1.005 - 1.030   pH 5.5 5.0 - 8.0   Glucose, UA NEGATIVE NEGATIVE mg/dL   Hgb urine dipstick NEGATIVE NEGATIVE   Bilirubin Urine NEGATIVE NEGATIVE   Ketones, ur NEGATIVE NEGATIVE mg/dL   Protein, ur NEGATIVE NEGATIVE mg/dL   Nitrite NEGATIVE NEGATIVE   Leukocytes, UA NEGATIVE NEGATIVE  Imaging Review Dg Chest 2 View  10/26/2015  CLINICAL DATA:  Dyspnea and chest pain for 2 weeks. EXAM: CHEST  2 VIEW COMPARISON:  None. FINDINGS: The heart size and mediastinal contours are within normal limits. Both lungs are clear. The visualized skeletal structures are unremarkable. IMPRESSION: No active cardiopulmonary disease. Electronically Signed   By: Andreas Newport M.D.   On: 10/26/2015 06:41   I have personally reviewed and evaluated these images and lab results as part of my medical decision-making.   MDM   Final diagnoses:  Respiratory tract infection  Traumatic ecchymosis of chest, initial encounter    Respiratory tract infection which has lasted for several weeks. She'll be sent for chest x-ray to rule out pneumonia. Screening labs will be obtained. She may benefit from empiric treatment for possible Lyme disease. Will give therapeutic trial of albuterol with ipratropium via nebulizer.  Chest x-ray and laboratory workup were unremarkable. She feels significantly better after albuterol with ipratropium. She is discharged with prescriptions for prednisone, doxycycline, and albuterol is to follow-up with PCP in one week.  Delora Fuel, MD [id number]

## 2015-10-26 NOTE — Discharge Instructions (Signed)
Your testing today did not show any definite abnormalities. Please discuss with your primary care provider whether you should continue on the antibiotic for a full month to treat possible Lyme disease. Return to emergency department if symptoms are getting worse.  Upper Respiratory Infection, Adult Most upper respiratory infections (URIs) are a viral infection of the air passages leading to the lungs. A URI affects the nose, throat, and upper air passages. The most common type of URI is nasopharyngitis and is typically referred to as "the common cold." URIs run their course and usually go away on their own. Most of the time, a URI does not require medical attention, but sometimes a bacterial infection in the upper airways can follow a viral infection. This is called a secondary infection. Sinus and middle ear infections are common types of secondary upper respiratory infections. Bacterial pneumonia can also complicate a URI. A URI can worsen asthma and chronic obstructive pulmonary disease (COPD). Sometimes, these complications can require emergency medical care and may be life threatening.  CAUSES Almost all URIs are caused by viruses. A virus is a type of germ and can spread from one person to another.  RISKS FACTORS You may be at risk for a URI if:   You smoke.   You have chronic heart or lung disease.  You have a weakened defense (immune) system.   You are very young or very old.   You have nasal allergies or asthma.  You work in crowded or poorly ventilated areas.  You work in health care facilities or schools. SIGNS AND SYMPTOMS  Symptoms typically develop 2-3 days after you come in contact with a cold virus. Most viral URIs last 7-10 days. However, viral URIs from the influenza virus (flu virus) can last 14-18 days and are typically more severe. Symptoms may include:   Runny or stuffy (congested) nose.   Sneezing.   Cough.   Sore throat.   Headache.   Fatigue.    Fever.   Loss of appetite.   Pain in your forehead, behind your eyes, and over your cheekbones (sinus pain).  Muscle aches.  DIAGNOSIS  Your health care provider may diagnose a URI by:  Physical exam.  Tests to check that your symptoms are not due to another condition such as:  Strep throat.  Sinusitis.  Pneumonia.  Asthma. TREATMENT  A URI goes away on its own with time. It cannot be cured with medicines, but medicines may be prescribed or recommended to relieve symptoms. Medicines may help:  Reduce your fever.  Reduce your cough.  Relieve nasal congestion. HOME CARE INSTRUCTIONS   Take medicines only as directed by your health care provider.   Gargle warm saltwater or take cough drops to comfort your throat as directed by your health care provider.  Use a warm mist humidifier or inhale steam from a shower to increase air moisture. This may make it easier to breathe.  Drink enough fluid to keep your urine clear or pale yellow.   Eat soups and other clear broths and maintain good nutrition.   Rest as needed.   Return to work when your temperature has returned to normal or as your health care provider advises. You may need to stay home longer to avoid infecting others. You can also use a face mask and careful hand washing to prevent spread of the virus.  Increase the usage of your inhaler if you have asthma.   Do not use any tobacco products, including cigarettes, chewing  tobacco, or electronic cigarettes. If you need help quitting, ask your health care provider. PREVENTION  The best way to protect yourself from getting a cold is to practice good hygiene.   Avoid oral or hand contact with people with cold symptoms.   Wash your hands often if contact occurs.  There is no clear evidence that vitamin C, vitamin E, echinacea, or exercise reduces the chance of developing a cold. However, it is always recommended to get plenty of rest, exercise, and  practice good nutrition.  SEEK MEDICAL CARE IF:   You are getting worse rather than better.   Your symptoms are not controlled by medicine.   You have chills.  You have worsening shortness of breath.  You have brown or red mucus.  You have yellow or brown nasal discharge.  You have pain in your face, especially when you bend forward.  You have a fever.  You have swollen neck glands.  You have pain while swallowing.  You have white areas in the back of your throat. SEEK IMMEDIATE MEDICAL CARE IF:   You have severe or persistent:  Headache.  Ear pain.  Sinus pain.  Chest pain.  You have chronic lung disease and any of the following:  Wheezing.  Prolonged cough.  Coughing up blood.  A change in your usual mucus.  You have a stiff neck.  You have changes in your:  Vision.  Hearing.  Thinking.  Mood. MAKE SURE YOU:   Understand these instructions.  Will watch your condition.  Will get help right away if you are not doing well or get worse.   This information is not intended to replace advice given to you by your health care provider. Make sure you discuss any questions you have with your health care provider.   Document Released: 05/10/2001 Document Revised: 03/31/2015 Document Reviewed: 02/19/2014 Elsevier Interactive Patient Education 2016 [employer]  Prednisone tablets What is this medicine? PREDNISONE (PRED ni sone) is a corticosteroid. It is commonly used to treat inflammation of the skin, joints, lungs, and other organs. Common conditions treated include asthma, allergies, and arthritis. It is also used for other conditions, such as blood disorders and diseases of the adrenal glands. This medicine may be used for other purposes; ask your health care provider or pharmacist if you have questions. What should I tell my health care provider before I take this medicine? They need to know if you have any of these conditions: -Cushing's  syndrome -diabetes -glaucoma -heart disease -high blood pressure -infection (especially a virus infection such as chickenpox, cold sores, or herpes) -kidney disease -liver disease -mental illness -myasthenia gravis -osteoporosis -seizures -stomach or intestine problems -thyroid disease -an unusual or allergic reaction to lactose, prednisone, other medicines, foods, dyes, or preservatives -pregnant or trying to get pregnant -breast-feeding How should I use this medicine? Take this medicine by mouth with a glass of water. Follow the directions on the prescription label. Take this medicine with food. If you are taking this medicine once a day, take it in the morning. Do not take more medicine than you are told to take. Do not suddenly stop taking your medicine because you may develop a severe reaction. Your doctor will tell you how much medicine to take. If your doctor wants you to stop the medicine, the dose may be slowly lowered over time to avoid any side effects. Talk to your pediatrician regarding the use of this medicine in children. Special care may be needed. Overdosage: If  you think you have taken too much of this medicine contact a poison control center or emergency room at once. NOTE: This medicine is only for you. Do not share this medicine with others. What if I miss a dose? If you miss a dose, take it as soon as you can. If it is almost time for your next dose, talk to your doctor or health care professional. You may need to miss a dose or take an extra dose. Do not take double or extra doses without advice. What may interact with this medicine? Do not take this medicine with any of the following medications: -metyrapone -mifepristone This medicine may also interact with the following medications: -aminoglutethimide -amphotericin B -aspirin and aspirin-like medicines -barbiturates -certain medicines for diabetes, like glipizide or  glyburide -cholestyramine -cholinesterase inhibitors -cyclosporine -digoxin -diuretics -ephedrine -female hormones, like estrogens and birth control pills -isoniazid -ketoconazole -NSAIDS, medicines for pain and inflammation, like ibuprofen or naproxen -phenytoin -rifampin -toxoids -vaccines -warfarin This list may not describe all possible interactions. Give your health care provider a list of all the medicines, herbs, non-prescription drugs, or dietary supplements you use. Also tell them if you smoke, drink alcohol, or use illegal drugs. Some items may interact with your medicine. What should I watch for while using this medicine? Visit your doctor or health care professional for regular checks on your progress. If you are taking this medicine over a prolonged period, carry an identification card with your name and address, the type and dose of your medicine, and your doctor's name and address. This medicine may increase your risk of getting an infection. Tell your doctor or health care professional if you are around anyone with measles or chickenpox, or if you develop sores or blisters that do not heal properly. If you are going to have surgery, tell your doctor or health care professional that you have taken this medicine within the last twelve months. Ask your doctor or health care professional about your diet. You may need to lower the amount of salt you eat. This medicine may affect blood sugar levels. If you have diabetes, check with your doctor or health care professional before you change your diet or the dose of your diabetic medicine. What side effects may I notice from receiving this medicine? Side effects that you should report to your doctor or health care professional as soon as possible: -allergic reactions like skin rash, itching or hives, swelling of the face, lips, or tongue -changes in emotions or moods -changes in vision -depressed mood -eye pain -fever or chills,  cough, sore throat, pain or difficulty passing urine -increased thirst -swelling of ankles, feet Side effects that usually do not require medical attention (report to your doctor or health care professional if they continue or are bothersome): -confusion, excitement, restlessness -headache -nausea, vomiting -skin problems, acne, thin and shiny skin -trouble sleeping -weight gain This list may not describe all possible side effects. Call your doctor for medical advice about side effects. You may report side effects to FDA at 1-800-FDA-1088. Where should I keep my medicine? Keep out of the reach of children. Store at room temperature between 15 and 30 degrees C (59 and 86 degrees F). Protect from light. Keep container tightly closed. Throw away any unused medicine after the expiration date. NOTE: This sheet is a summary. It may not cover all possible information. If you have questions about this medicine, talk to your doctor, pharmacist, or health care provider.    2016, Elsevier/Gold Standard. (  2011-06-30 10:57:14)  Doxycycline tablets or capsules What is this medicine? DOXYCYCLINE (dox i SYE kleen) is a tetracycline antibiotic. It kills certain bacteria or stops their growth. It is used to treat many kinds of infections, like dental, skin, respiratory, and urinary tract infections. It also treats acne, Lyme disease, malaria, and certain sexually transmitted infections. This medicine may be used for other purposes; ask your health care provider or pharmacist if you have questions. What should I tell my health care provider before I take this medicine? They need to know if you have any of these conditions: -liver disease -long exposure to sunlight like working outdoors -stomach problems like colitis -an unusual or allergic reaction to doxycycline, tetracycline antibiotics, other medicines, foods, dyes, or preservatives -pregnant or trying to get pregnant -breast-feeding How should I use  this medicine? Take this medicine by mouth with a full glass of water. Follow the directions on the prescription label. It is best to take this medicine without food, but if it upsets your stomach take it with food. Take your medicine at regular intervals. Do not take your medicine more often than directed. Take all of your medicine as directed even if you think you are better. Do not skip doses or stop your medicine early. Talk to your pediatrician regarding the use of this medicine in children. While this drug may be prescribed for selected conditions, precautions do apply. Overdosage: If you think you have taken too much of this medicine contact a poison control center or emergency room at once. NOTE: This medicine is only for you. Do not share this medicine with others. What if I miss a dose? If you miss a dose, take it as soon as you can. If it is almost time for your next dose, take only that dose. Do not take double or extra doses. What may interact with this medicine? -antacids -barbiturates -birth control pills -bismuth subsalicylate -carbamazepine -methoxyflurane -other antibiotics -phenytoin -vitamins that contain iron -warfarin This list may not describe all possible interactions. Give your health care provider a list of all the medicines, herbs, non-prescription drugs, or dietary supplements you use. Also tell them if you smoke, drink alcohol, or use illegal drugs. Some items may interact with your medicine. What should I watch for while using this medicine? Tell your doctor or health care professional if your symptoms do not improve. Do not treat diarrhea with over the counter products. Contact your doctor if you have diarrhea that lasts more than 2 days or if it is severe and watery. Do not take this medicine just before going to bed. It may not dissolve properly when you lay down and can cause pain in your throat. Drink plenty of fluids while taking this medicine to also help  reduce irritation in your throat. This medicine can make you more sensitive to the sun. Keep out of the sun. If you cannot avoid being in the sun, wear protective clothing and use sunscreen. Do not use sun lamps or tanning beds/booths. Birth control pills may not work properly while you are taking this medicine. Talk to your doctor about using an extra method of birth control. If you are being treated for a sexually transmitted infection, avoid sexual contact until you have finished your treatment. Your sexual partner may also need treatment. Avoid antacids, aluminum, calcium, magnesium, and iron products for 4 hours before and 2 hours after taking a dose of this medicine. If you are using this medicine to prevent malaria, you should still  protect yourself from contact with mosquitos. Stay in screened-in areas, use mosquito nets, keep your body covered, and use an insect repellent. What side effects may I notice from receiving this medicine? Side effects that you should report to your doctor or health care professional as soon as possible: -allergic reactions like skin rash, itching or hives, swelling of the face, lips, or tongue -difficulty breathing -fever -itching in the rectal or genital area -pain on swallowing -redness, blistering, peeling or loosening of the skin, including inside the mouth -severe stomach pain or cramps -unusual bleeding or bruising -unusually weak or tired -yellowing of the eyes or skin Side effects that usually do not require medical attention (report to your doctor or health care professional if they continue or are bothersome): -diarrhea -loss of appetite -nausea, vomiting This list may not describe all possible side effects. Call your doctor for medical advice about side effects. You may report side effects to FDA at 1-800-FDA-1088. Where should I keep my medicine? Keep out of the reach of children. Store at room temperature, below 30 degrees C (86 degrees F).  Protect from light. Keep container tightly closed. Throw away any unused medicine after the expiration date. Taking this medicine after the expiration date can make you seriously ill. NOTE: This sheet is a summary. It may not cover all possible information. If you have questions about this medicine, talk to your doctor, pharmacist, or health care provider.    2016, Elsevier/Gold Standard. (2015-03-06 12:10:28)  Albuterol inhalation aerosol What is this medicine? ALBUTEROL (al [place]) is a bronchodilator. It helps open up the airways in your lungs to make it easier to breathe. This medicine is used to treat and to prevent bronchospasm. This medicine may be used for other purposes; ask your health care provider or pharmacist if you have questions. What should I tell my health care provider before I take this medicine? They need to know if you have any of the following conditions: -diabetes -heart disease or irregular heartbeat -high blood pressure -pheochromocytoma -seizures -thyroid disease -an unusual or allergic reaction to albuterol, levalbuterol, sulfites, other medicines, foods, dyes, or preservatives -pregnant or trying to get pregnant -breast-feeding How should I use this medicine? This medicine is for inhalation through the mouth. Follow the directions on your prescription label. Take your medicine at regular intervals. Do not use more often than directed. Make sure that you are using your inhaler correctly. Ask you doctor or health care provider if you have any questions. Talk to your pediatrician regarding the use of this medicine in children. Special care may be needed. Overdosage: If you think you have taken too much of this medicine contact a poison control center or emergency room at once. NOTE: This medicine is only for you. Do not share this medicine with others. What if I miss a dose? If you miss a dose, use it as soon as you can. If it is almost time for your next  dose, use only that dose. Do not use double or extra doses. What may interact with this medicine? -anti-infectives like chloroquine and pentamidine -caffeine -cisapride -diuretics -medicines for colds -medicines for depression or for emotional or psychotic conditions -medicines for weight loss including some herbal products -methadone -some antibiotics like clarithromycin, erythromycin, levofloxacin, and linezolid -some heart medicines -steroid hormones like dexamethasone, cortisone, hydrocortisone -theophylline -thyroid hormones This list may not describe all possible interactions. Give your health care provider a list of all the medicines, herbs, non-prescription drugs, or dietary  supplements you use. Also tell them if you smoke, drink alcohol, or use illegal drugs. Some items may interact with your medicine. What should I watch for while using this medicine? Tell your doctor or health care professional if your symptoms do not improve. Do not use extra albuterol. If your asthma or bronchitis gets worse while you are using this medicine, call your doctor right away. If your mouth gets dry try chewing sugarless gum or sucking hard candy. Drink water as directed. What side effects may I notice from receiving this medicine? Side effects that you should report to your doctor or health care professional as soon as possible: -allergic reactions like skin rash, itching or hives, swelling of the face, lips, or tongue -breathing problems -chest pain -feeling faint or lightheaded, falls -high blood pressure -irregular heartbeat -fever -muscle cramps or weakness -pain, tingling, numbness in the hands or feet -vomiting Side effects that usually do not require medical attention (report to your doctor or health care professional if they continue or are bothersome): -cough -difficulty sleeping -headache -nervousness or trembling -stomach upset -stuffy or runny nose -throat  irritation -unusual taste This list may not describe all possible side effects. Call your doctor for medical advice about side effects. You may report side effects to FDA at 1-800-FDA-1088. Where should I keep my medicine? Keep out of the reach of children. Store at room temperature between 15 and 30 degrees C (59 and 86 degrees F). The contents are under pressure and may burst when exposed to heat or flame. Do not freeze. This medicine does not work as well if it is too cold. Throw away any unused medicine after the expiration date. Inhalers need to be thrown away after the labeled number of puffs have been used or by the expiration date; whichever comes first. Ventolin HFA should be thrown away 12 months after removing from foil pouch. Check the instructions that come with your medicine. NOTE: This sheet is a summary. It may not cover all possible information. If you have questions about this medicine, talk to your doctor, pharmacist, or health care provider.    2016, Elsevier/Gold Standard. (2013-05-02 10:57:17)

## 2015-10-26 NOTE — ED Notes (Signed)
Pt reports she has been experiencing flu-like symptoms for the last month. Pt reports she has not seen her PCP for symptoms. Pt has been taking OTC medication with no relief. Pt reports on Thursday afternoon, she noticed bruising to her right rib area.

## 2015-10-28 LAB — CULTURE, GROUP A STREP: Strep A Culture: NEGATIVE

## 2015-11-09 ENCOUNTER — Other Ambulatory Visit: Payer: Self-pay | Admitting: Gastroenterology

## 2016-08-25 ENCOUNTER — Other Ambulatory Visit: Payer: Self-pay | Admitting: Physician Assistant

## 2016-08-25 ENCOUNTER — Other Ambulatory Visit (HOSPITAL_COMMUNITY)
Admission: RE | Admit: 2016-08-25 | Discharge: 2016-08-25 | Disposition: A | Discharge status: Home / Self Care | Payer: 59 | Source: Ambulatory Visit | Attending: Physician Assistant | Admitting: Physician Assistant

## 2016-08-25 DIAGNOSIS — Z113 Encounter for screening for infections with a predominantly sexual mode of transmission: Secondary | ICD-10-CM | POA: Diagnosis present

## 2016-08-25 DIAGNOSIS — Z124 Encounter for screening for malignant neoplasm of cervix: Secondary | ICD-10-CM | POA: Diagnosis present

## 2016-08-25 DIAGNOSIS — Z1151 Encounter for screening for human papillomavirus (HPV): Secondary | ICD-10-CM | POA: Diagnosis not present

## 2016-08-26 LAB — CYTOLOGY - PAP

## 2016-09-29 ENCOUNTER — Other Ambulatory Visit: Payer: Self-pay | Admitting: Physician Assistant

## 2016-09-29 DIAGNOSIS — Z1231 Encounter for screening mammogram for malignant neoplasm of breast: Secondary | ICD-10-CM

## 2016-10-19 ENCOUNTER — Ambulatory Visit
Admission: RE | Admit: 2016-10-19 | Discharge: 2016-10-19 | Disposition: A | Discharge status: Home / Self Care | Payer: 59 | Source: Ambulatory Visit | Attending: Physician Assistant | Admitting: Physician Assistant

## 2016-10-19 DIAGNOSIS — Z1231 Encounter for screening mammogram for malignant neoplasm of breast: Secondary | ICD-10-CM

## 2016-11-30 DIAGNOSIS — L920 Granuloma annulare: Secondary | ICD-10-CM | POA: Diagnosis not present

## 2016-11-30 DIAGNOSIS — D2362 Other benign neoplasm of skin of left upper limb, including shoulder: Secondary | ICD-10-CM | POA: Diagnosis not present

## 2016-11-30 DIAGNOSIS — L821 Other seborrheic keratosis: Secondary | ICD-10-CM | POA: Diagnosis not present

## 2017-01-13 DIAGNOSIS — Z23 Encounter for immunization: Secondary | ICD-10-CM | POA: Diagnosis not present

## 2017-02-01 DIAGNOSIS — J0100 Acute maxillary sinusitis, unspecified: Secondary | ICD-10-CM | POA: Diagnosis not present

## 2017-02-18 DIAGNOSIS — S93432A Sprain of tibiofibular ligament of left ankle, initial encounter: Secondary | ICD-10-CM | POA: Diagnosis not present

## 2017-03-24 DIAGNOSIS — H6980 Other specified disorders of Eustachian tube, unspecified ear: Secondary | ICD-10-CM | POA: Diagnosis not present

## 2017-03-24 DIAGNOSIS — H6121 Impacted cerumen, right ear: Secondary | ICD-10-CM | POA: Diagnosis not present

## 2017-03-30 DIAGNOSIS — M26623 Arthralgia of bilateral temporomandibular joint: Secondary | ICD-10-CM | POA: Diagnosis not present

## 2017-03-30 DIAGNOSIS — H9202 Otalgia, left ear: Secondary | ICD-10-CM | POA: Diagnosis not present

## 2017-06-07 DIAGNOSIS — J069 Acute upper respiratory infection, unspecified: Secondary | ICD-10-CM | POA: Diagnosis not present

## 2017-09-14 DIAGNOSIS — J4530 Mild persistent asthma, uncomplicated: Secondary | ICD-10-CM | POA: Diagnosis not present

## 2017-09-14 DIAGNOSIS — J309 Allergic rhinitis, unspecified: Secondary | ICD-10-CM | POA: Diagnosis not present

## 2017-09-14 DIAGNOSIS — Z0000 Encounter for general adult medical examination without abnormal findings: Secondary | ICD-10-CM | POA: Diagnosis not present

## 2017-09-14 DIAGNOSIS — Z23 Encounter for immunization: Secondary | ICD-10-CM | POA: Diagnosis not present

## 2017-09-14 DIAGNOSIS — E559 Vitamin D deficiency, unspecified: Secondary | ICD-10-CM | POA: Diagnosis not present

## 2017-10-25 ENCOUNTER — Other Ambulatory Visit: Payer: Self-pay | Admitting: Family Medicine

## 2017-10-25 DIAGNOSIS — Z139 Encounter for screening, unspecified: Secondary | ICD-10-CM

## 2017-11-23 ENCOUNTER — Ambulatory Visit
Admission: RE | Admit: 2017-11-23 | Discharge: 2017-11-23 | Disposition: A | Discharge status: Home / Self Care | Payer: 59 | Source: Ambulatory Visit | Attending: Family Medicine | Admitting: Family Medicine

## 2017-11-23 DIAGNOSIS — Z139 Encounter for screening, unspecified: Secondary | ICD-10-CM

## 2017-11-23 DIAGNOSIS — Z1231 Encounter for screening mammogram for malignant neoplasm of breast: Secondary | ICD-10-CM | POA: Diagnosis not present

## 2017-12-24 DIAGNOSIS — G5690 Unspecified mononeuropathy of unspecified upper limb: Secondary | ICD-10-CM | POA: Diagnosis not present

## 2017-12-24 DIAGNOSIS — G5600 Carpal tunnel syndrome, unspecified upper limb: Secondary | ICD-10-CM | POA: Diagnosis not present

## 2017-12-26 DIAGNOSIS — G5600 Carpal tunnel syndrome, unspecified upper limb: Secondary | ICD-10-CM | POA: Diagnosis not present

## 2018-01-11 DIAGNOSIS — G5603 Carpal tunnel syndrome, bilateral upper limbs: Secondary | ICD-10-CM | POA: Diagnosis not present

## 2018-01-30 DIAGNOSIS — G5623 Lesion of ulnar nerve, bilateral upper limbs: Secondary | ICD-10-CM | POA: Diagnosis not present

## 2018-01-30 DIAGNOSIS — G5622 Lesion of ulnar nerve, left upper limb: Secondary | ICD-10-CM | POA: Diagnosis not present

## 2018-01-30 DIAGNOSIS — G5603 Carpal tunnel syndrome, bilateral upper limbs: Secondary | ICD-10-CM | POA: Diagnosis not present

## 2018-05-03 DIAGNOSIS — R110 Nausea: Secondary | ICD-10-CM | POA: Diagnosis not present

## 2018-05-03 DIAGNOSIS — G43909 Migraine, unspecified, not intractable, without status migrainosus: Secondary | ICD-10-CM | POA: Diagnosis not present

## 2018-09-19 DIAGNOSIS — R6883 Chills (without fever): Secondary | ICD-10-CM | POA: Diagnosis not present

## 2018-09-19 DIAGNOSIS — J4530 Mild persistent asthma, uncomplicated: Secondary | ICD-10-CM | POA: Diagnosis not present

## 2018-09-25 ENCOUNTER — Other Ambulatory Visit: Payer: Self-pay | Admitting: Family Medicine

## 2018-09-25 DIAGNOSIS — Z1211 Encounter for screening for malignant neoplasm of colon: Secondary | ICD-10-CM | POA: Diagnosis not present

## 2018-09-25 DIAGNOSIS — Z1231 Encounter for screening mammogram for malignant neoplasm of breast: Secondary | ICD-10-CM

## 2018-09-25 DIAGNOSIS — Z0000 Encounter for general adult medical examination without abnormal findings: Secondary | ICD-10-CM | POA: Diagnosis not present

## 2018-09-26 ENCOUNTER — Other Ambulatory Visit: Payer: Self-pay | Admitting: Family Medicine

## 2018-09-26 DIAGNOSIS — E2839 Other primary ovarian failure: Secondary | ICD-10-CM

## 2018-11-06 DIAGNOSIS — J069 Acute upper respiratory infection, unspecified: Secondary | ICD-10-CM | POA: Diagnosis not present

## 2018-11-06 DIAGNOSIS — J0100 Acute maxillary sinusitis, unspecified: Secondary | ICD-10-CM | POA: Diagnosis not present

## 2018-11-27 ENCOUNTER — Ambulatory Visit
Admission: RE | Admit: 2018-11-27 | Discharge: 2018-11-27 | Disposition: A | Discharge status: Home / Self Care | Payer: 59 | Source: Ambulatory Visit | Attending: Family Medicine | Admitting: Family Medicine

## 2018-11-27 ENCOUNTER — Other Ambulatory Visit: Payer: Self-pay | Admitting: Family Medicine

## 2018-11-27 DIAGNOSIS — Z13820 Encounter for screening for osteoporosis: Secondary | ICD-10-CM | POA: Diagnosis not present

## 2018-11-27 DIAGNOSIS — Z1231 Encounter for screening mammogram for malignant neoplasm of breast: Secondary | ICD-10-CM

## 2018-11-27 DIAGNOSIS — Z780 Asymptomatic menopausal state: Secondary | ICD-10-CM | POA: Diagnosis not present

## 2018-11-27 DIAGNOSIS — E2839 Other primary ovarian failure: Secondary | ICD-10-CM

## 2018-11-27 IMAGING — MG DIGITAL SCREENING BILATERAL MAMMOGRAM WITH TOMO AND CAD
8 series · 8 of 24 positions shown · non-contrast
Comparison: Previous exam(s).

CLINICAL DATA: Screening.

EXAM:
DIGITAL SCREENING BILATERAL MAMMOGRAM WITH TOMO AND CAD

[R CC synth-2D]
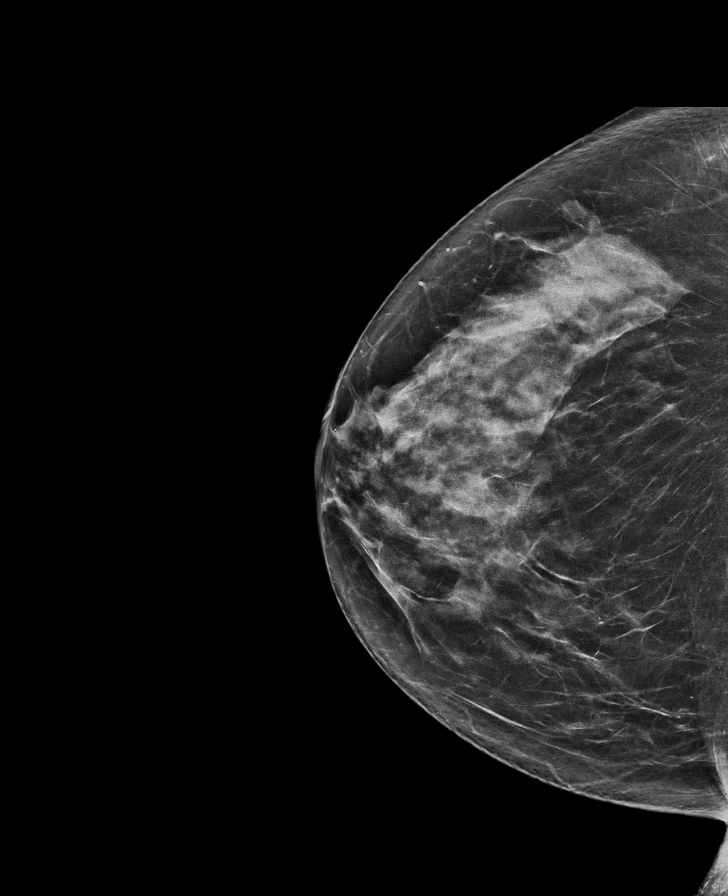

[L CC synth-2D]
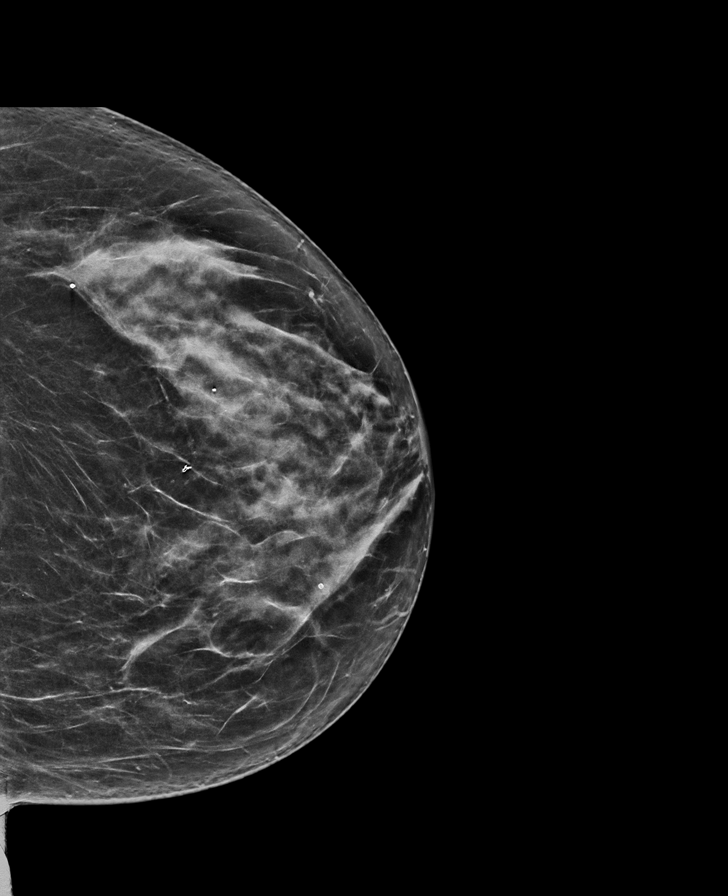

[R MLO synth-2D]
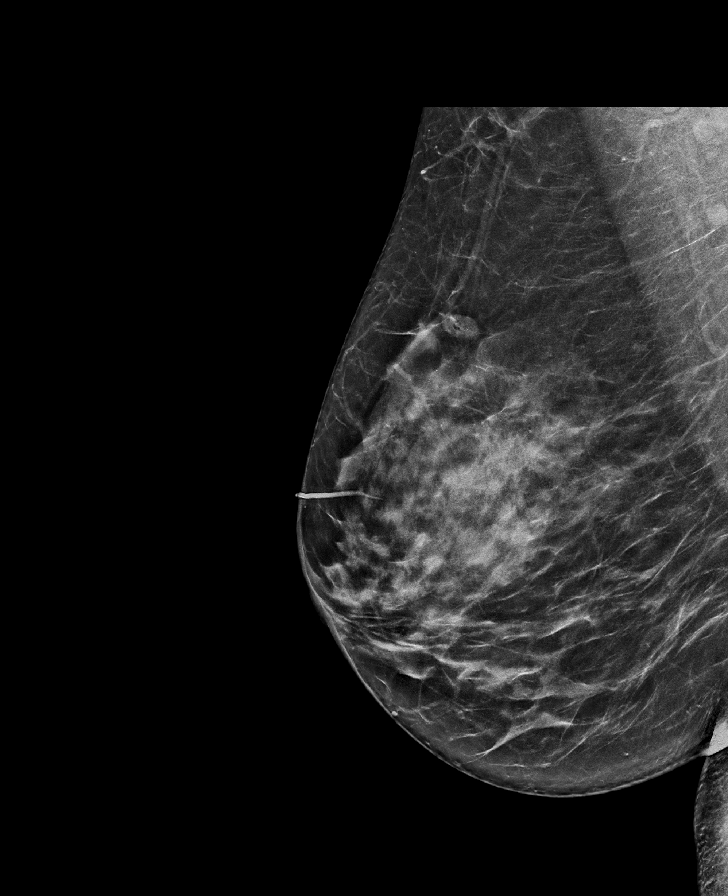

[L MLO synth-2D]
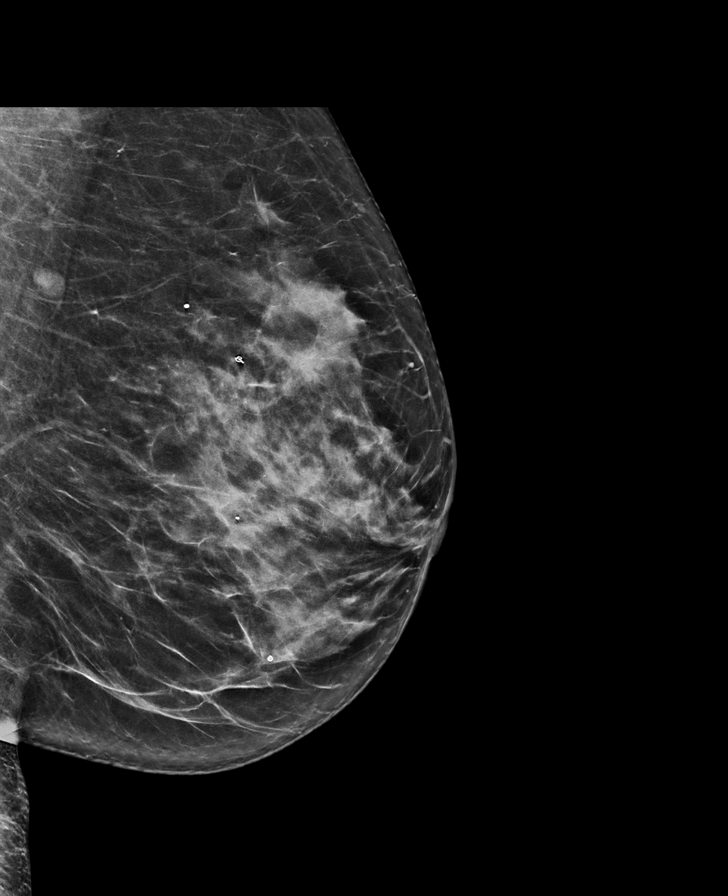

[L CC tomo · tomo slice 37/74.0]
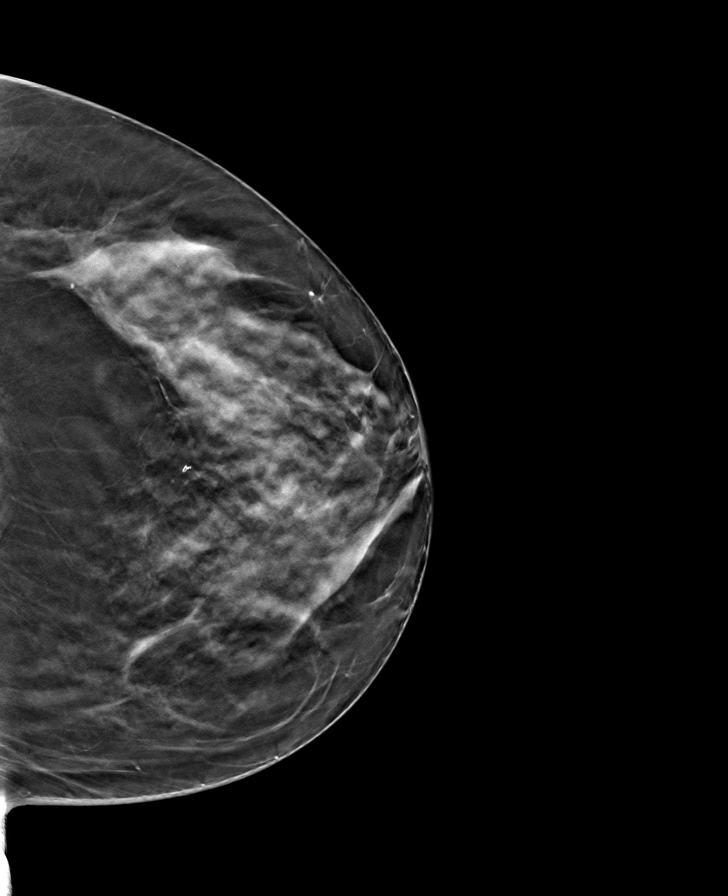

[R CC tomo · tomo slice 37/74.0]
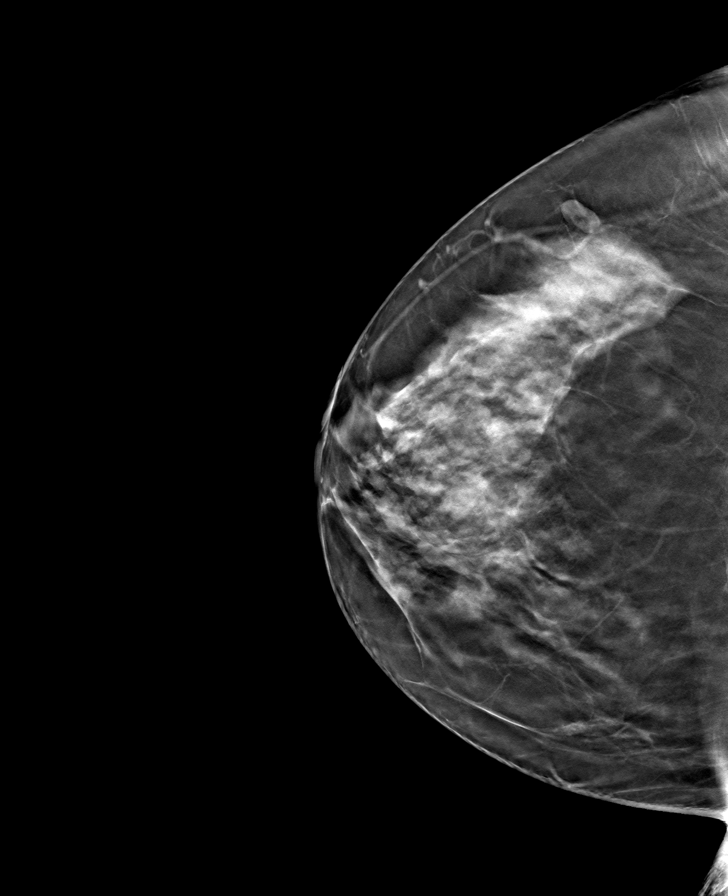

[R MLO tomo · tomo slice 38/75.0]
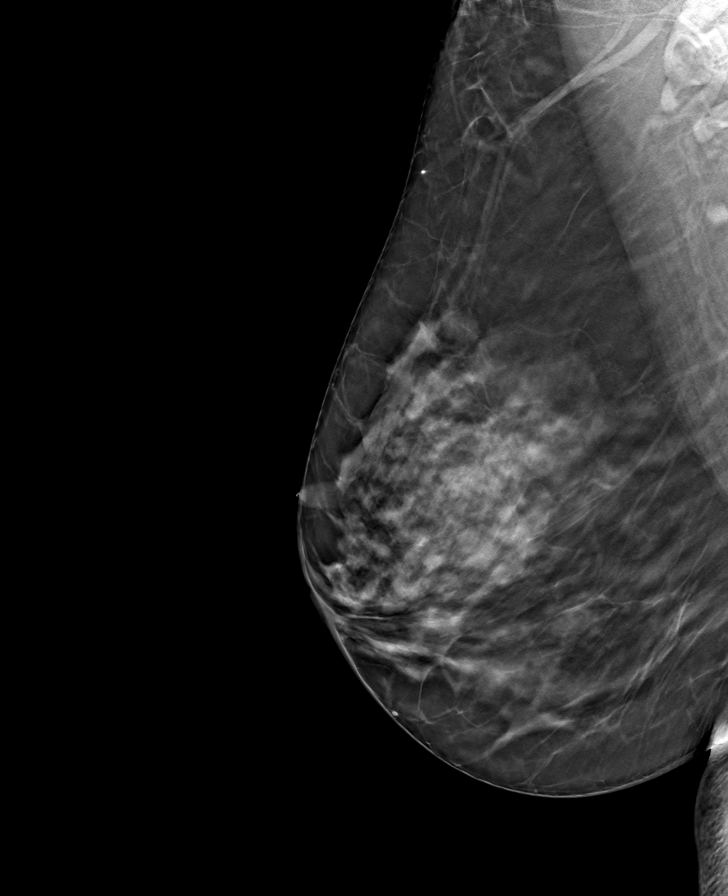

[L MLO tomo · tomo slice 37/74.0]
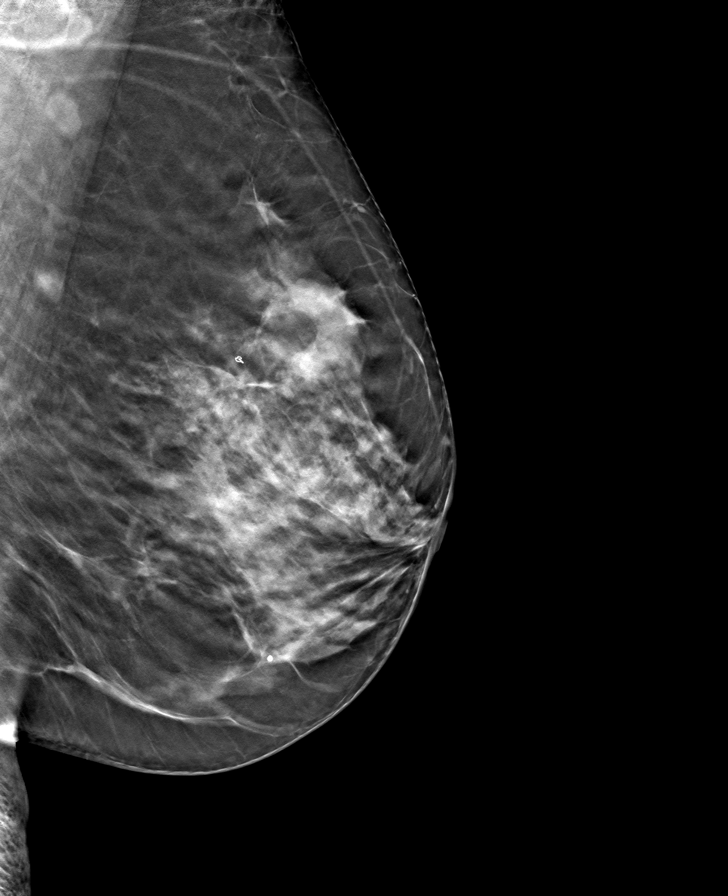

[8 of 24 positions shown; findings below may reference images not displayed]

ACR Breast Density Category c: The breast tissue is heterogeneously
dense, which may obscure small masses.
FINDINGS: There are no findings suspicious for malignancy. Images were
processed with CAD.
IMPRESSION: No mammographic evidence of malignancy. A result letter of this
screening mammogram will be mailed directly to the patient.

RECOMMENDATION:
Screening mammogram in one year. (Code:[5V])

BI-RADS CATEGORY  1: Negative.

## 2019-01-08 DIAGNOSIS — J0100 Acute maxillary sinusitis, unspecified: Secondary | ICD-10-CM | POA: Diagnosis not present

## 2019-01-23 DIAGNOSIS — D3122 Benign neoplasm of left retina: Secondary | ICD-10-CM | POA: Diagnosis not present

## 2019-03-29 DIAGNOSIS — J0191 Acute recurrent sinusitis, unspecified: Secondary | ICD-10-CM | POA: Diagnosis not present

## 2019-03-29 DIAGNOSIS — R6883 Chills (without fever): Secondary | ICD-10-CM | POA: Diagnosis not present

## 2019-03-29 DIAGNOSIS — J309 Allergic rhinitis, unspecified: Secondary | ICD-10-CM | POA: Diagnosis not present

## 2019-04-12 DIAGNOSIS — D3122 Benign neoplasm of left retina: Secondary | ICD-10-CM | POA: Diagnosis not present

## 2019-10-29 ENCOUNTER — Other Ambulatory Visit: Payer: Self-pay | Admitting: Family Medicine

## 2019-10-29 DIAGNOSIS — Z1231 Encounter for screening mammogram for malignant neoplasm of breast: Secondary | ICD-10-CM

## 2019-12-19 ENCOUNTER — Other Ambulatory Visit: Payer: Self-pay

## 2019-12-19 ENCOUNTER — Ambulatory Visit
Admission: RE | Admit: 2019-12-19 | Discharge: 2019-12-19 | Disposition: A | Discharge status: Home / Self Care | Payer: 59 | Source: Ambulatory Visit | Attending: Family Medicine | Admitting: Family Medicine

## 2019-12-19 DIAGNOSIS — Z1231 Encounter for screening mammogram for malignant neoplasm of breast: Secondary | ICD-10-CM

## 2019-12-19 IMAGING — MG DIGITAL SCREENING BILAT W/ TOMO W/ CAD
8 series · 8 of 24 positions shown · non-contrast
Comparison: Previous exam(s).

CLINICAL DATA: Screening.

EXAM:
DIGITAL SCREENING BILATERAL MAMMOGRAM WITH TOMO AND CAD

[R MLO synth-2D]
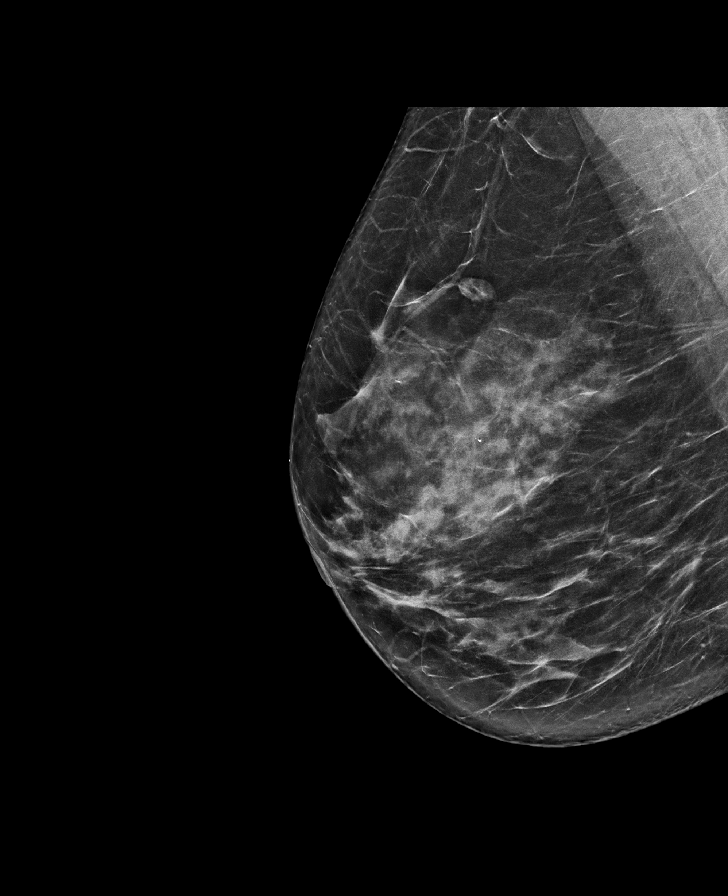

[L MLO synth-2D]
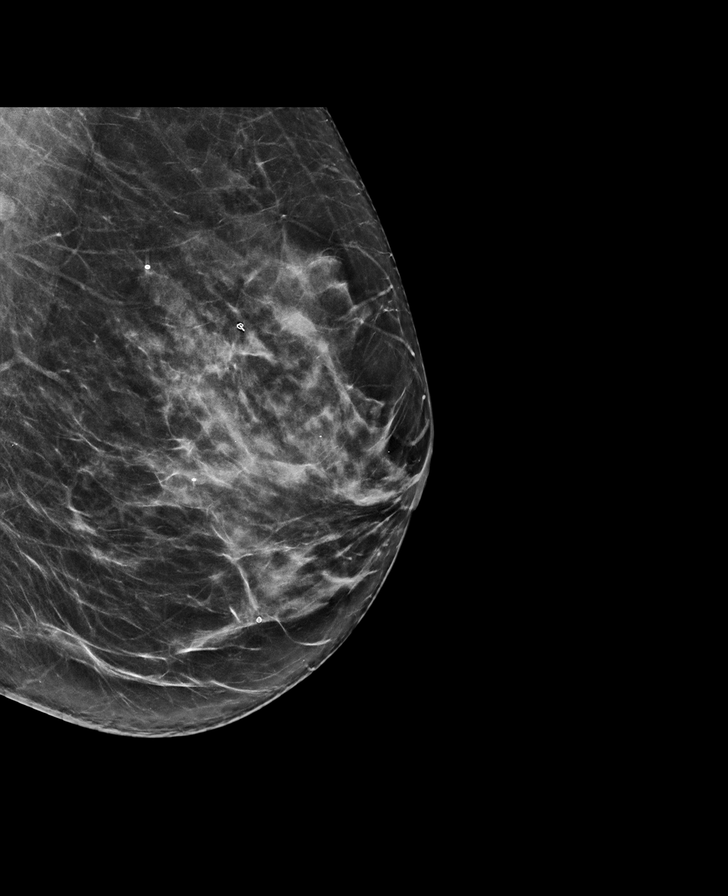

[L CC synth-2D]
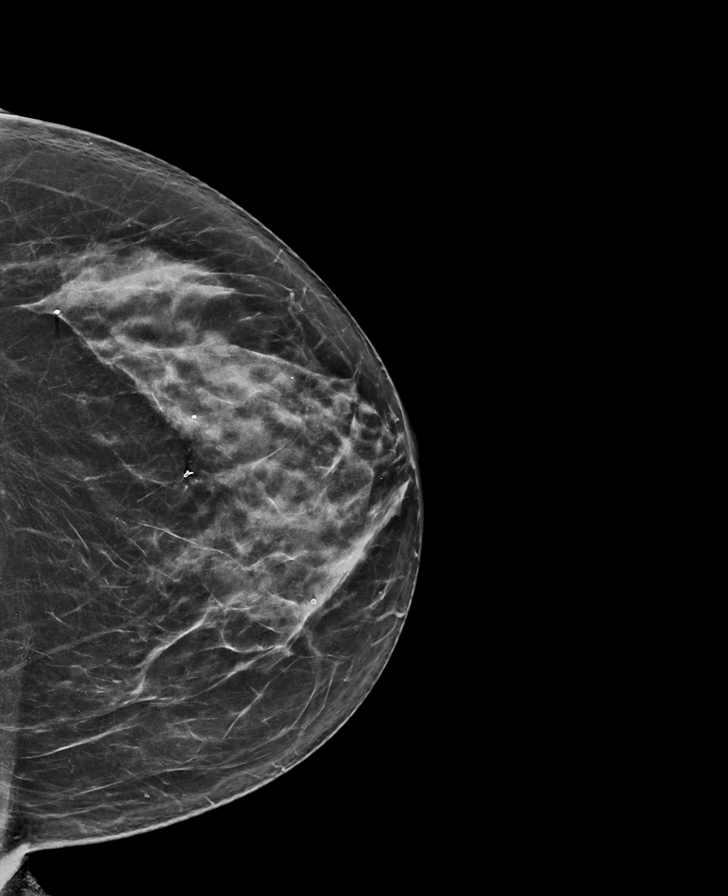

[R CC synth-2D]
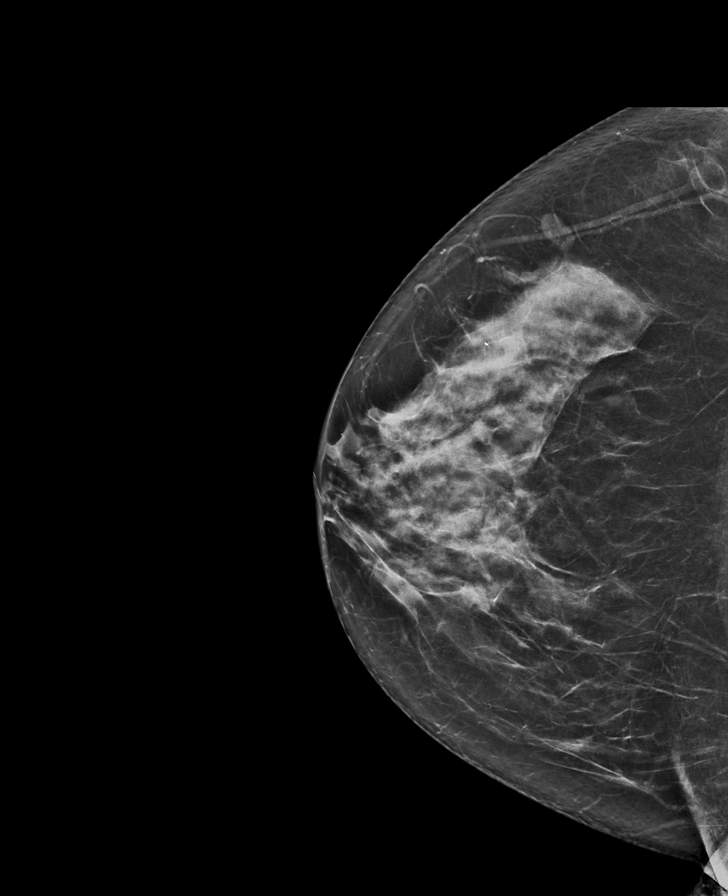

[R MLO tomo · tomo slice 39/78.0]
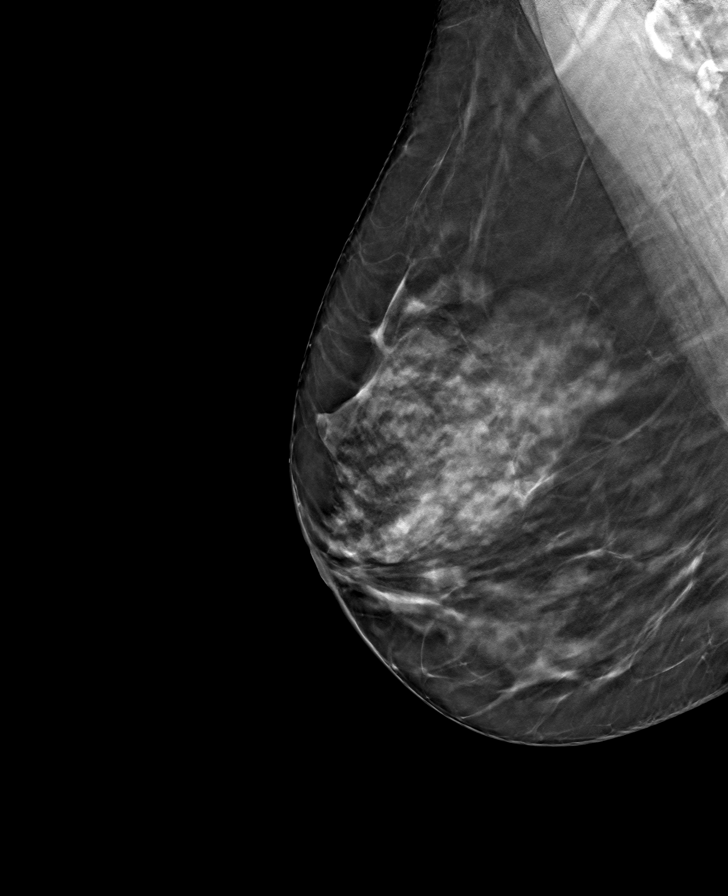

[L CC tomo · tomo slice 39/76.0]
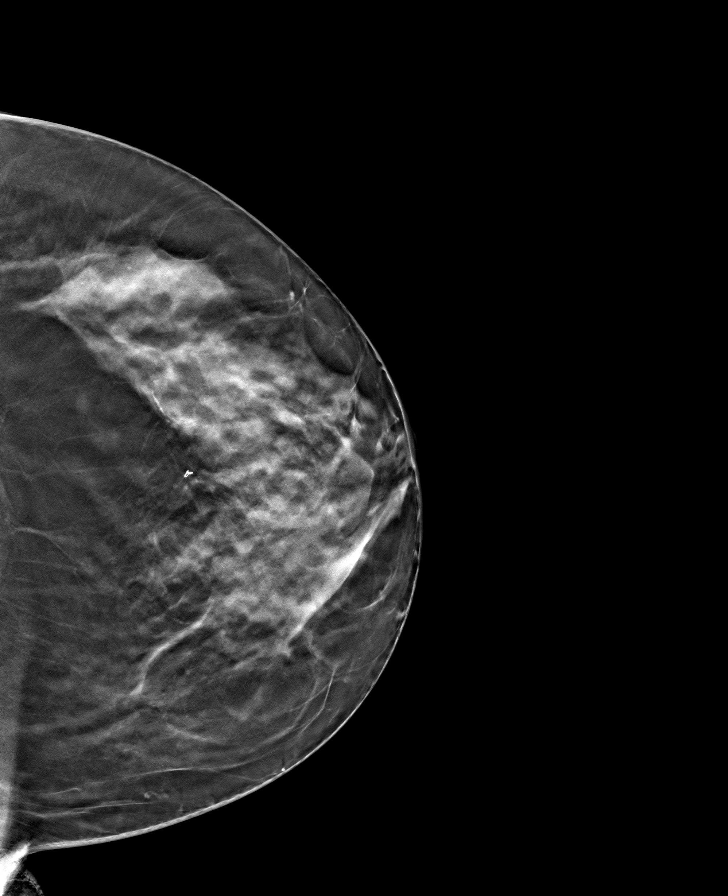

[R CC tomo · tomo slice 39/77.0]
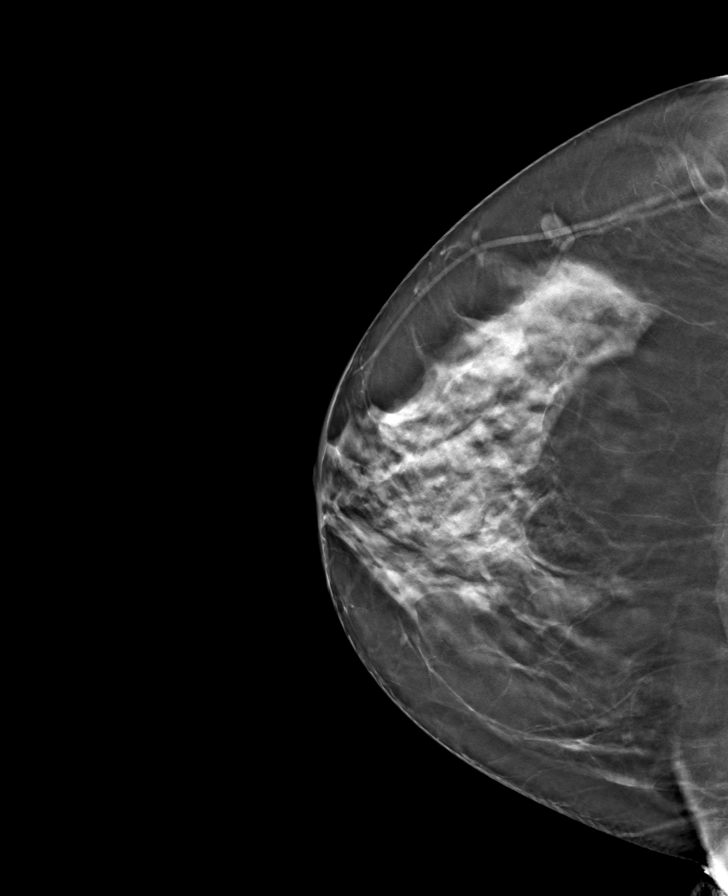

[L MLO tomo · tomo slice 37/74.0]
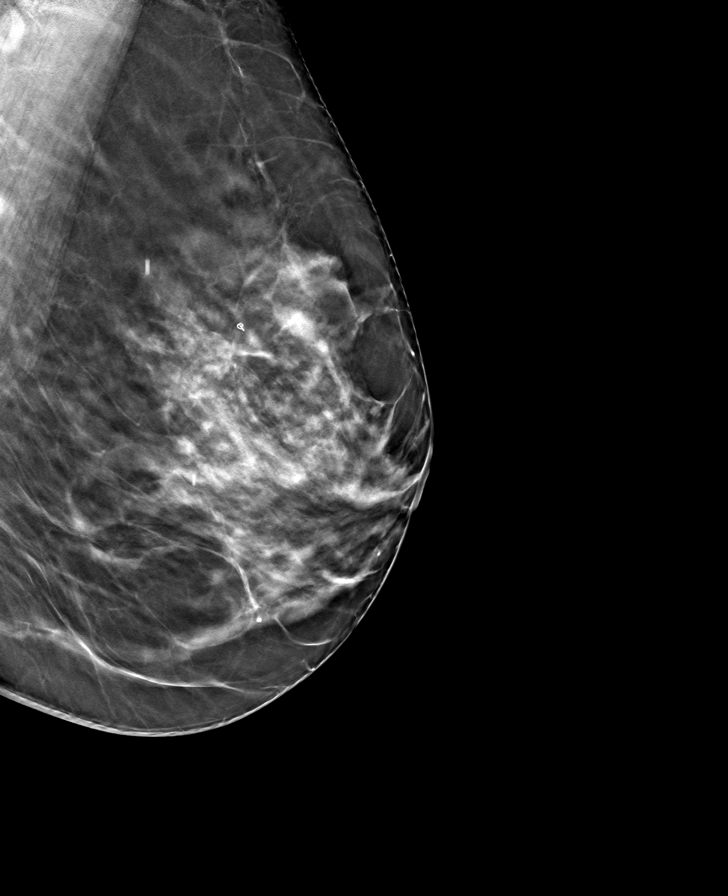

[8 of 24 positions shown; findings below may reference images not displayed]

ACR Breast Density Category c: The breast tissue is heterogeneously
dense, which may obscure small masses.
FINDINGS: There are no findings suspicious for malignancy. Images were
processed with CAD.
IMPRESSION: No mammographic evidence of malignancy. A result letter of this
screening mammogram will be mailed directly to the patient.

RECOMMENDATION:
Screening mammogram in one year. (Code:[5V])

BI-RADS CATEGORY  1: Negative.

## 2021-02-01 ENCOUNTER — Other Ambulatory Visit: Payer: Self-pay | Admitting: Family Medicine

## 2021-02-01 DIAGNOSIS — Z1231 Encounter for screening mammogram for malignant neoplasm of breast: Secondary | ICD-10-CM

## 2021-03-11 ENCOUNTER — Other Ambulatory Visit: Payer: Self-pay | Admitting: Specialist

## 2021-03-11 DIAGNOSIS — R519 Headache, unspecified: Secondary | ICD-10-CM

## 2021-03-11 DIAGNOSIS — G3184 Mild cognitive impairment, so stated: Secondary | ICD-10-CM

## 2021-03-11 DIAGNOSIS — M5412 Radiculopathy, cervical region: Secondary | ICD-10-CM

## 2021-03-24 ENCOUNTER — Other Ambulatory Visit: Payer: Self-pay

## 2021-03-24 ENCOUNTER — Ambulatory Visit
Admission: RE | Admit: 2021-03-24 | Discharge: 2021-03-24 | Disposition: A | Discharge status: Home / Self Care | Payer: 59 | Source: Ambulatory Visit | Attending: Family Medicine | Admitting: Family Medicine

## 2021-03-24 DIAGNOSIS — Z1231 Encounter for screening mammogram for malignant neoplasm of breast: Secondary | ICD-10-CM

## 2021-03-24 IMAGING — MG MM DIGITAL SCREENING BILAT W/ TOMO AND CAD
6 of 10 series · 6 of 30 positions shown · non-contrast
Comparison: Previous exam(s).

CLINICAL DATA: Screening.

EXAM:
DIGITAL SCREENING BILATERAL MAMMOGRAM WITH TOMOSYNTHESIS AND CAD
TECHNIQUE: Bilateral screening digital craniocaudal and mediolateral oblique
mammograms were obtained. Bilateral screening digital breast
tomosynthesis was performed. The images were evaluated with
computer-aided detection.

[L MLO synth-2D (1 of 2)]
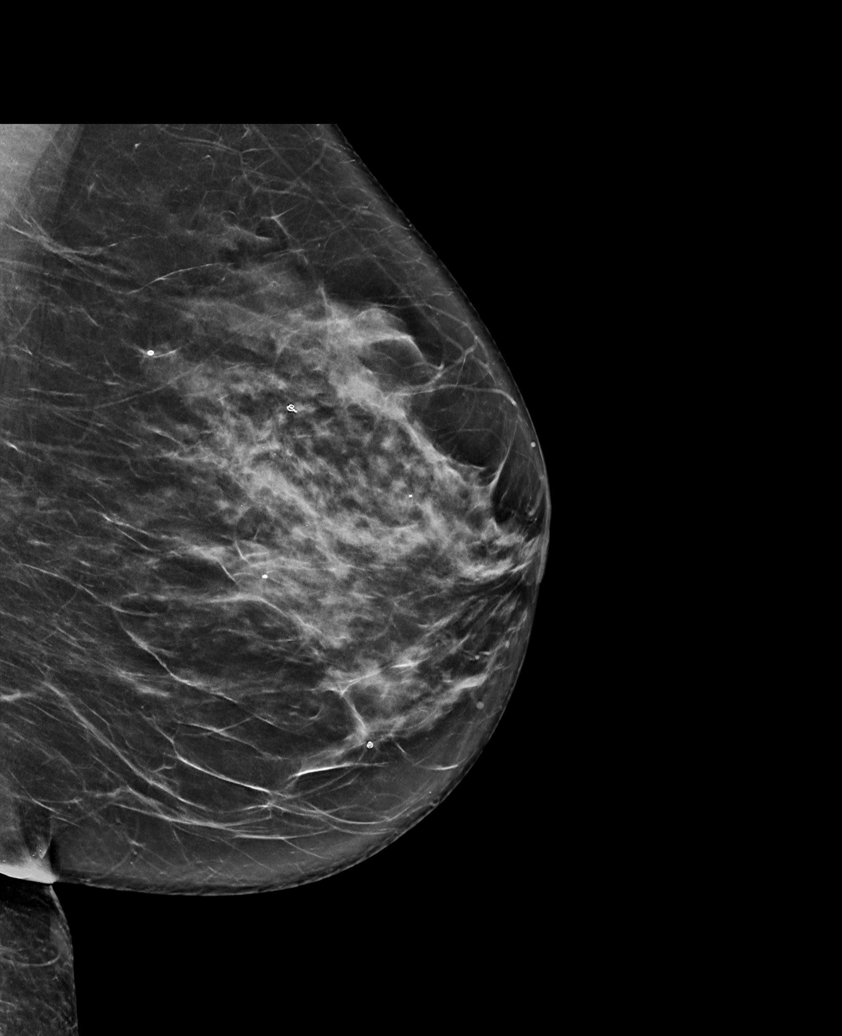

[R CC synth-2D]
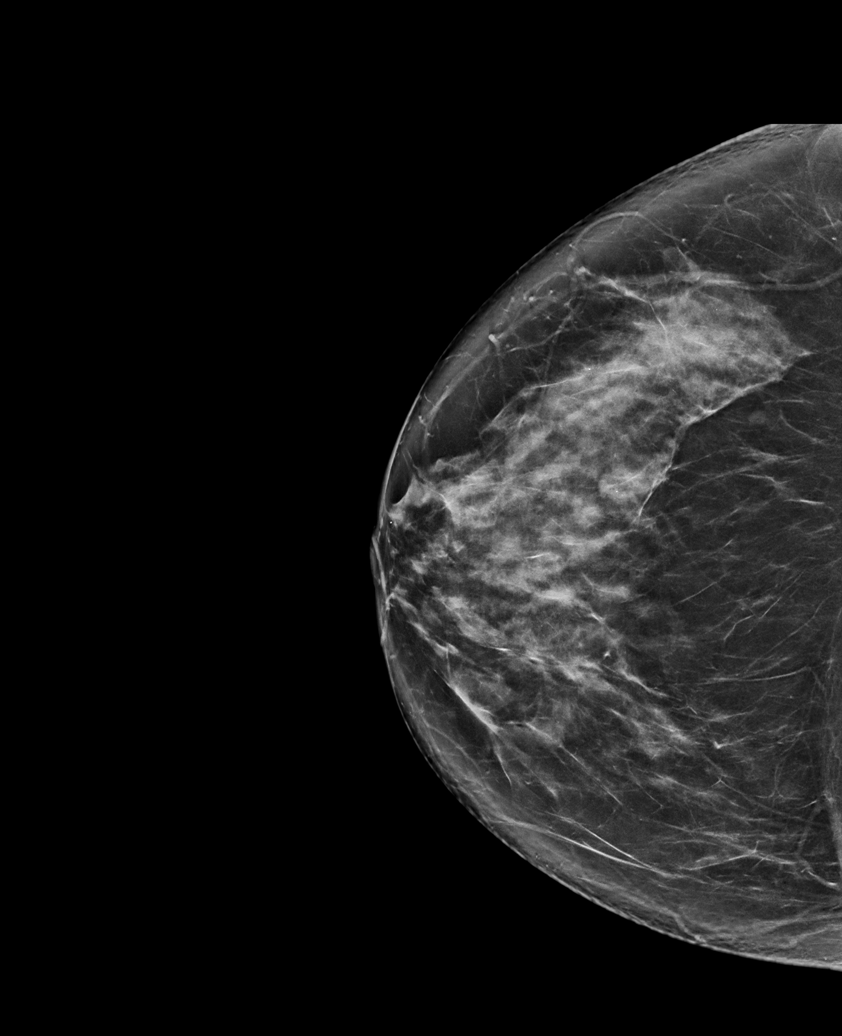

[L MLO synth-2D (2 of 2)]
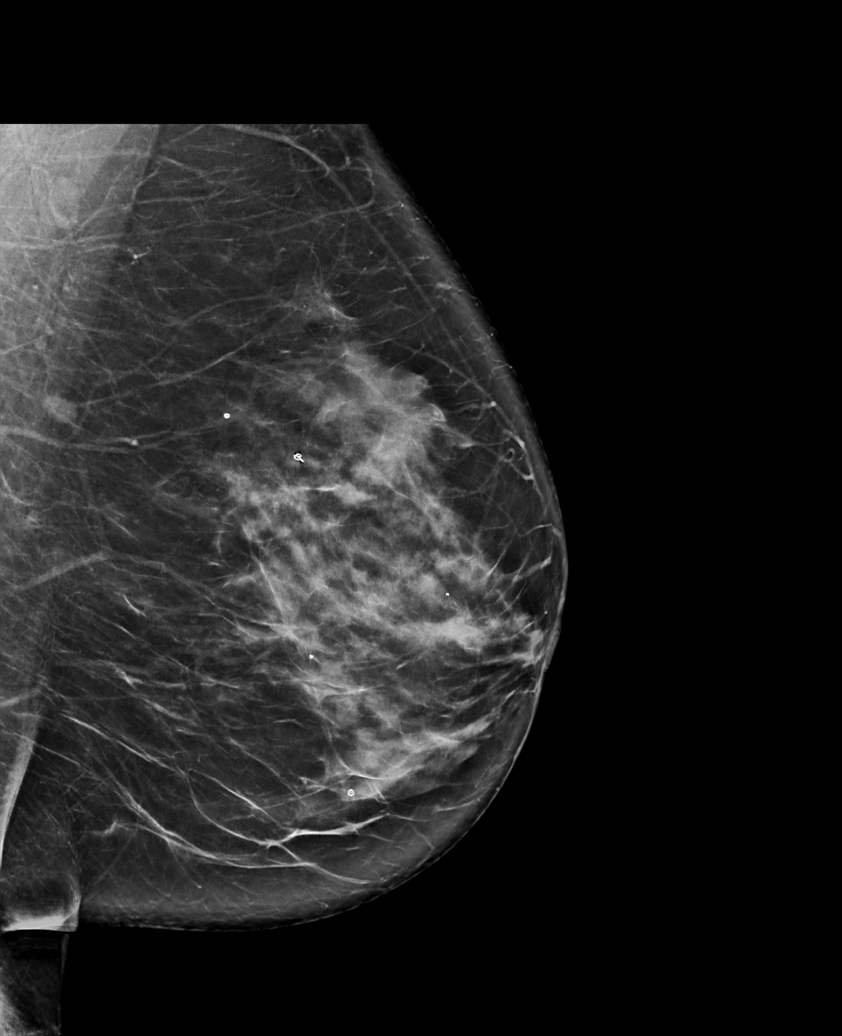

[R MLO synth-2D]
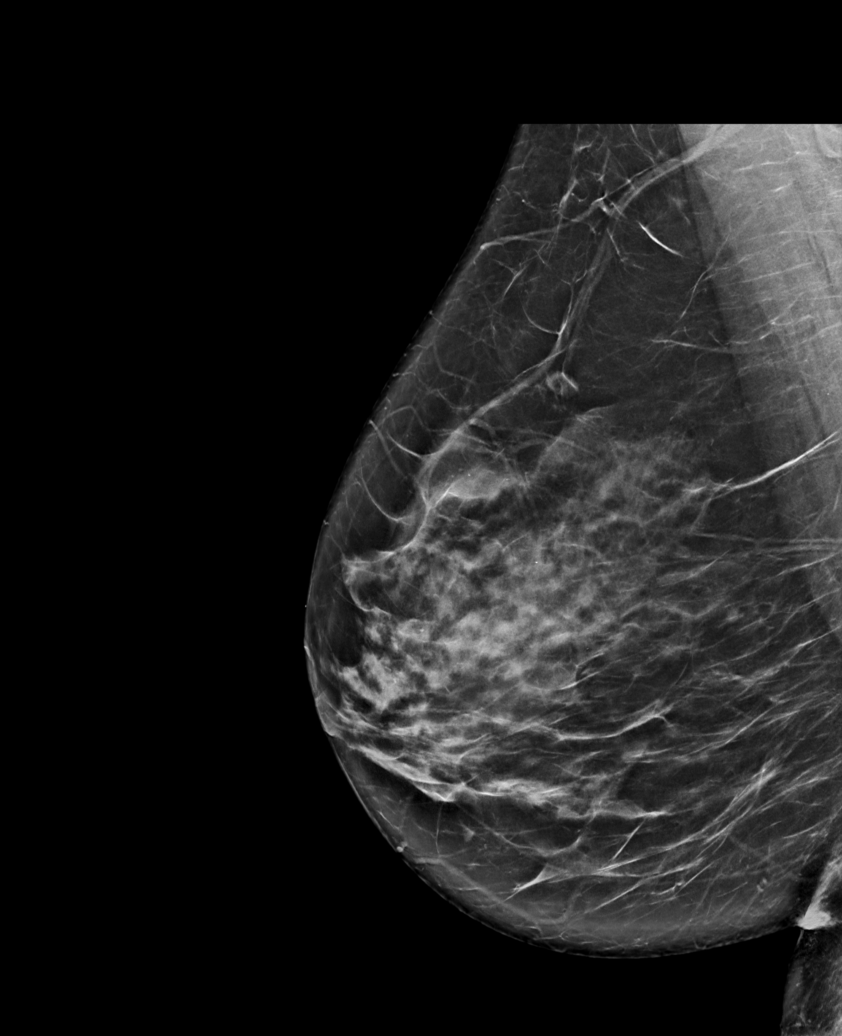

[L CC synth-2D]
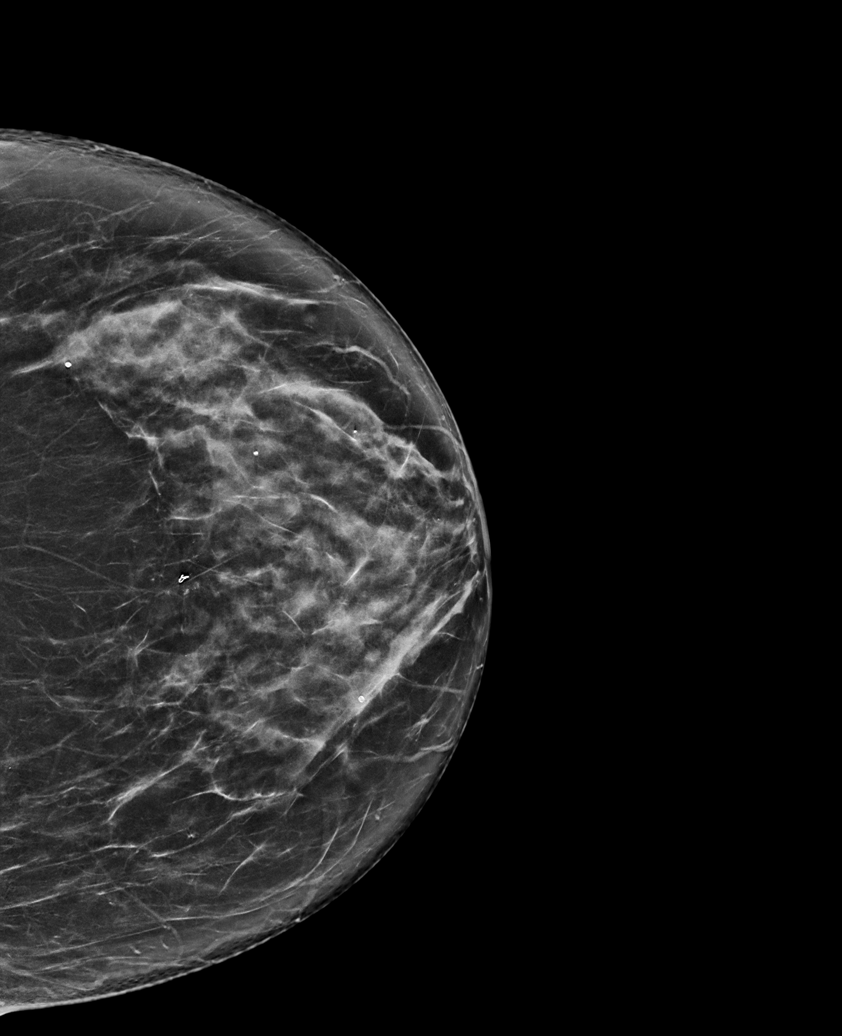

[R MLO tomo · tomo slice 43/86.0]
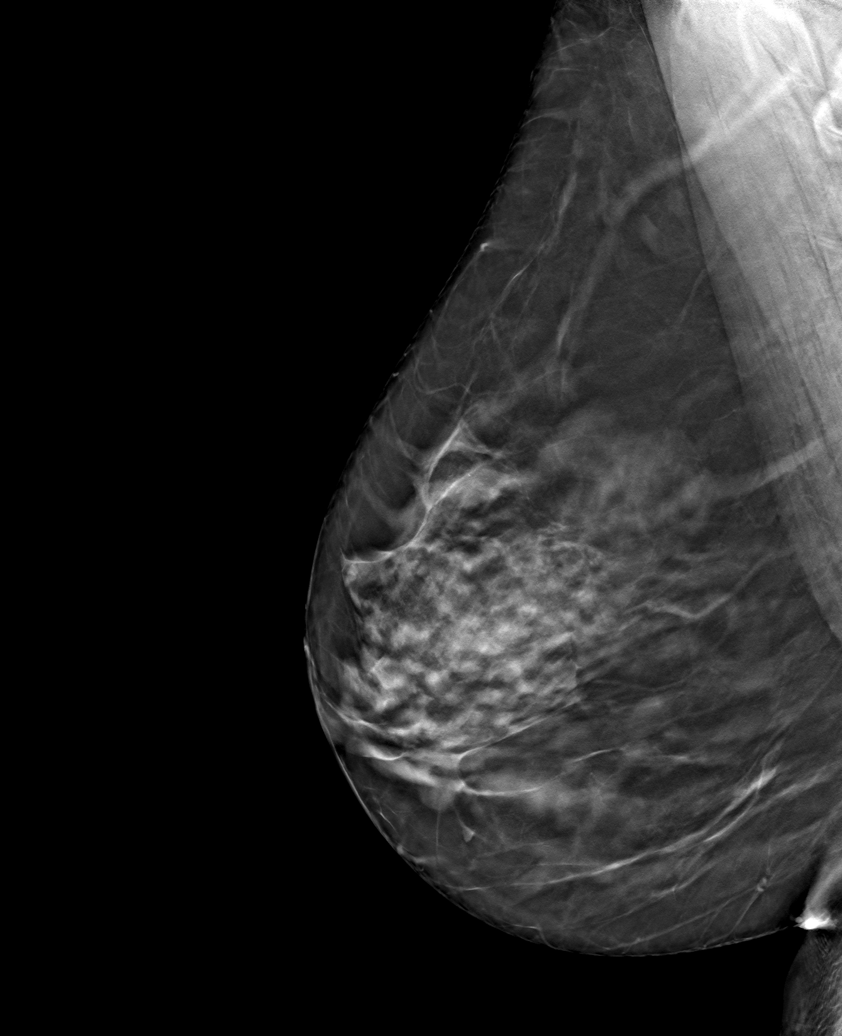

[6 of 30 positions shown; findings below may reference images not displayed]

ACR Breast Density Category c: The breast tissue is heterogeneously
dense, which may obscure small masses.
FINDINGS: There are no findings suspicious for malignancy. The images were
evaluated with computer-aided detection.
IMPRESSION: No mammographic evidence of malignancy. A result letter of this
screening mammogram will be mailed directly to the patient.

RECOMMENDATION:
Screening mammogram in one year. (Code:[6J])

BI-RADS CATEGORY  1: Negative.

## 2021-03-25 ENCOUNTER — Ambulatory Visit
Admission: RE | Admit: 2021-03-25 | Discharge: 2021-03-25 | Disposition: A | Discharge status: Home / Self Care | Payer: 59 | Source: Ambulatory Visit | Attending: Specialist | Admitting: Specialist

## 2021-03-25 DIAGNOSIS — R519 Headache, unspecified: Secondary | ICD-10-CM

## 2021-03-25 DIAGNOSIS — G3184 Mild cognitive impairment, so stated: Secondary | ICD-10-CM

## 2021-03-25 DIAGNOSIS — M5412 Radiculopathy, cervical region: Secondary | ICD-10-CM

## 2021-03-25 IMAGING — MR MR CERVICAL SPINE W/O CM
4 of 5 series · 27 of 48 positions shown · non-contrast
Comparison: No pertinent prior exams available for comparison.

CLINICAL DATA: Provided history: Cervical radiculitis. Additional
history provided: Patient reports history of migraines, numbness in
bilateral shoulders, arms, hands. New onset of numbness in bilateral
first toes.

EXAM:
MRI CERVICAL SPINE WITHOUT CONTRAST
TECHNIQUE: Multiplanar, multisequence MR imaging of the cervical spine was
performed. No intravenous contrast was administered.

[Series 5: T2 · sagittal · 3.0mm · 0.55mm/px · 6 of 15 slices shown (1 of 2)]
[im 1/15]
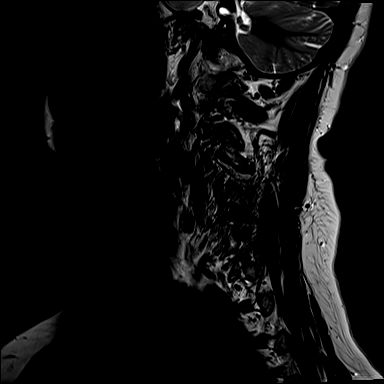
[im 3/15]
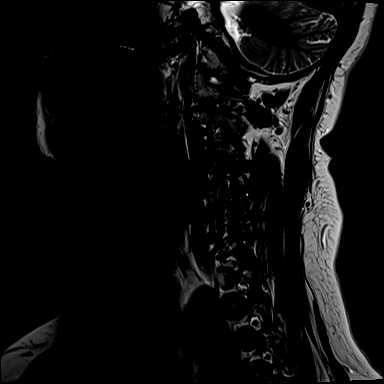
[im 6/15]
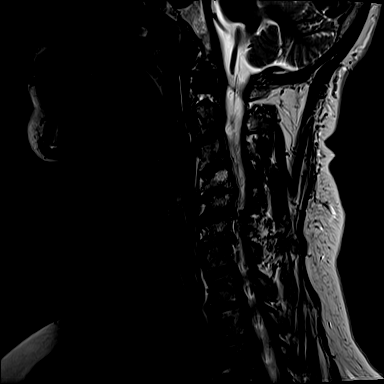
[im 9/15]
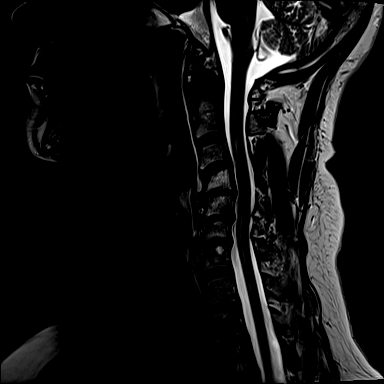
[im 12/15]
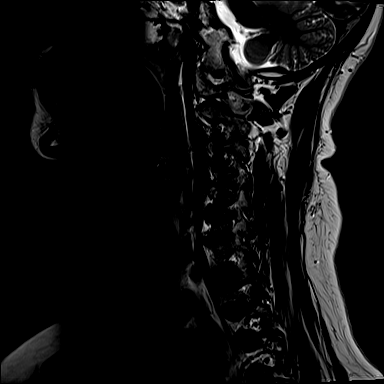
[im 15/15]
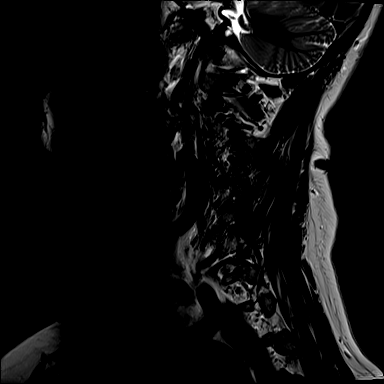

[Series 6: T1 · sagittal · 3.0mm · 0.82mm/px · 7 of 15 slices shown]
[im 1/15]
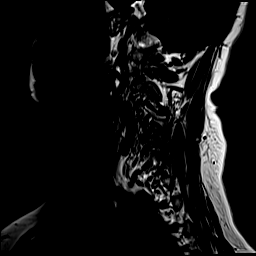
[im 3/15]
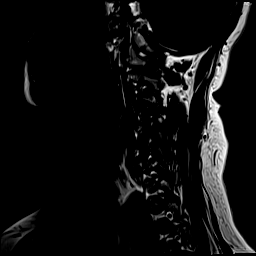
[im 5/15]
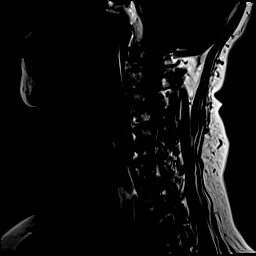
[im 8/15]
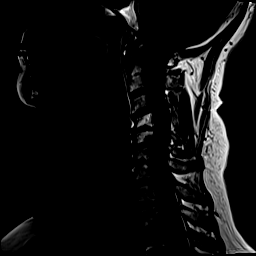
[im 10/15]
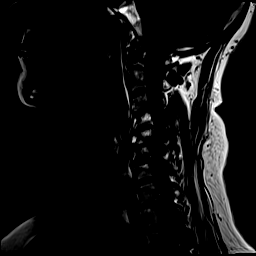
[im 12/15]
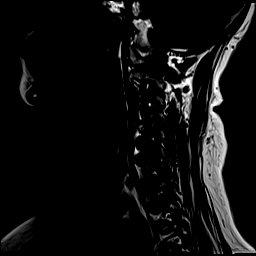
[im 15/15]
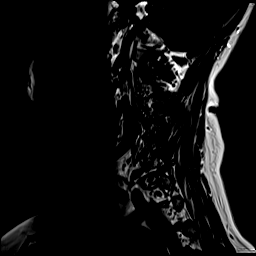

[Series 7: STIR · sagittal · 3.0mm · 0.33mm/px · 6 of 15 slices shown]
[im 1/15]
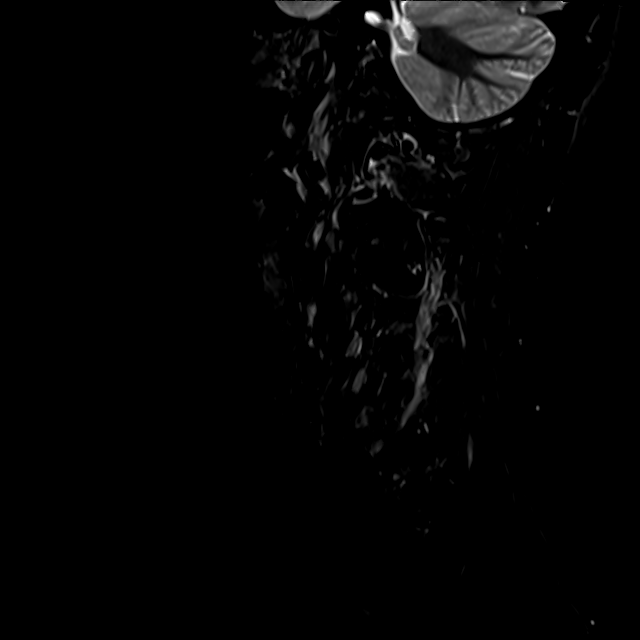
[im 3/15]
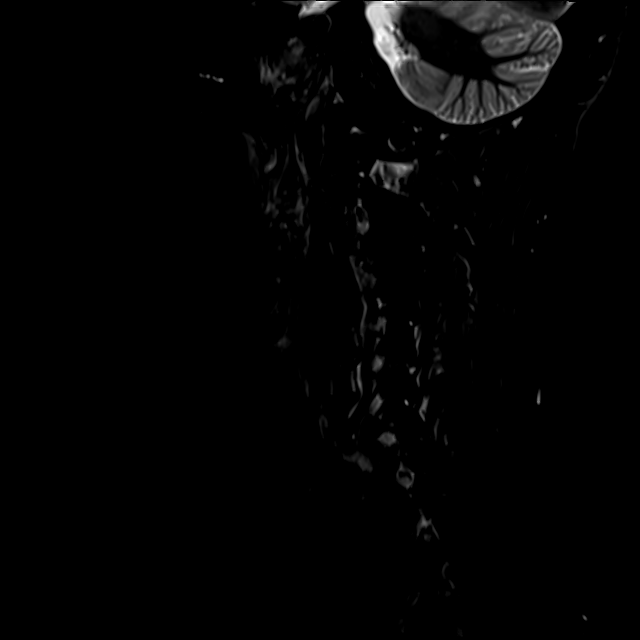
[im 5/15]
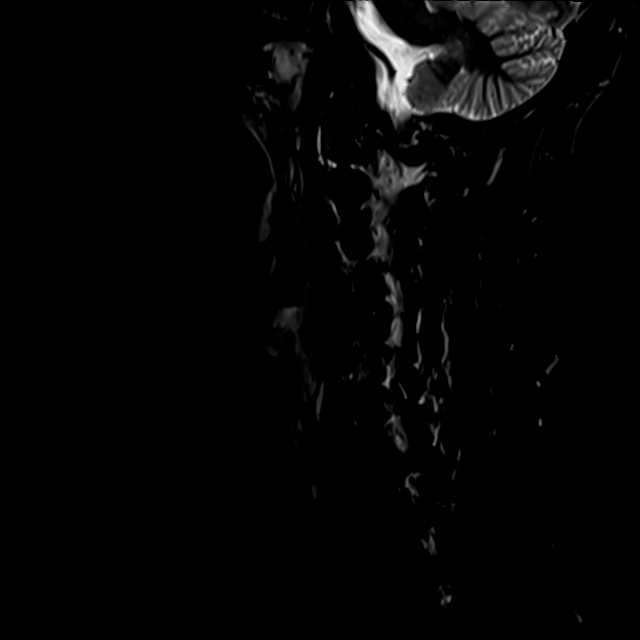
[im 8/15]
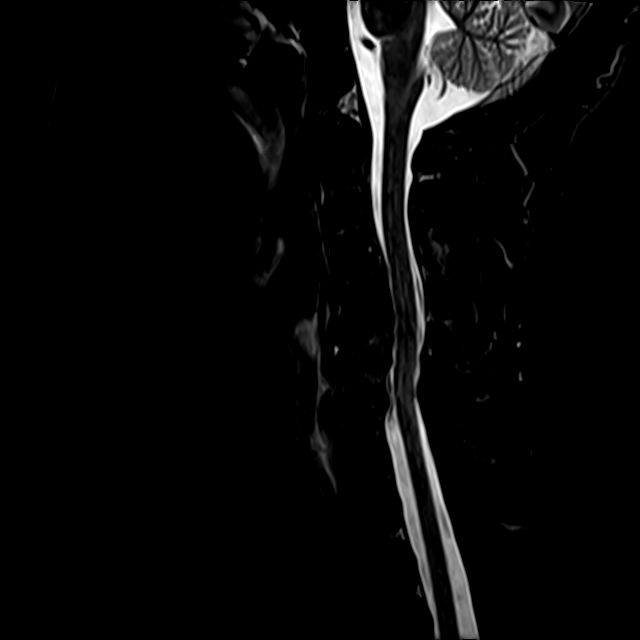
[im 10/15]
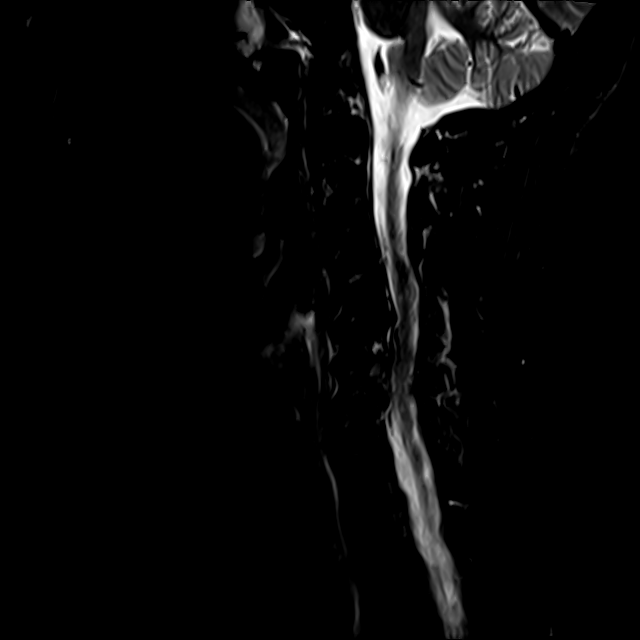
[im 12/15]
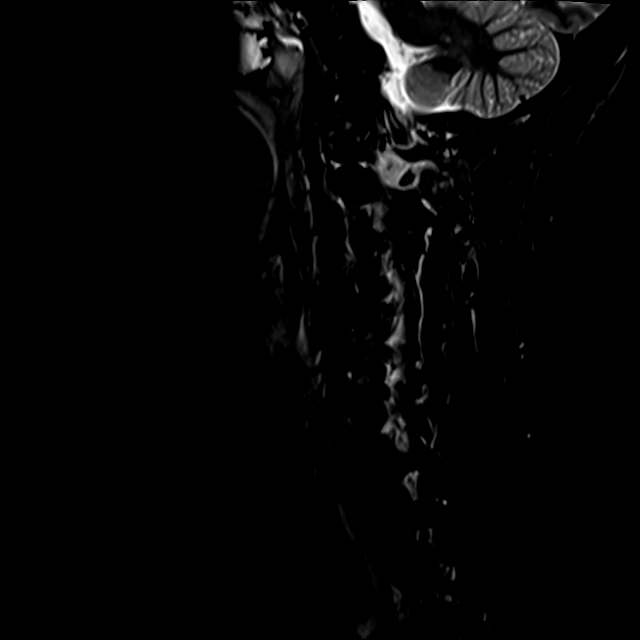

[Series 8: T2 · axial · 3.0mm · 0.50mm/px · z∈[-213,-122]mm · 8 of 30 slices shown (2 of 2)]
[im 1/30]
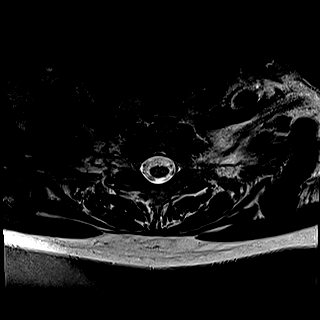
[im 5/30]
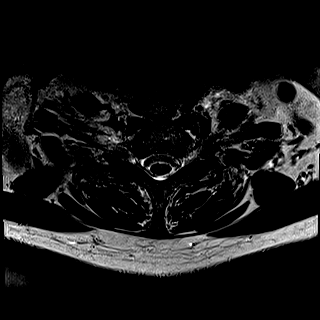
[im 9/30]
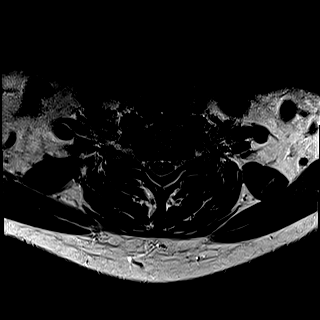
[im 14/30]
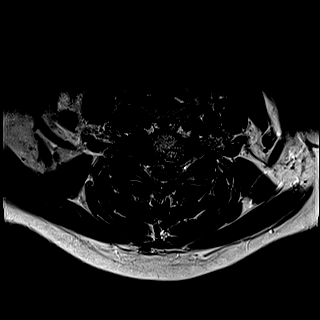
[im 16/30]
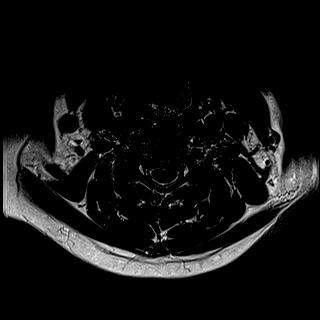
[im 21/30]
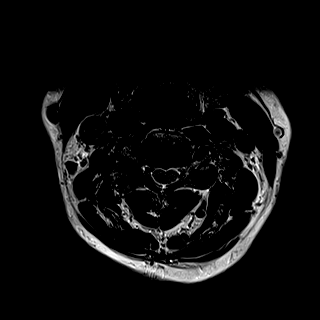
[im 25/30]
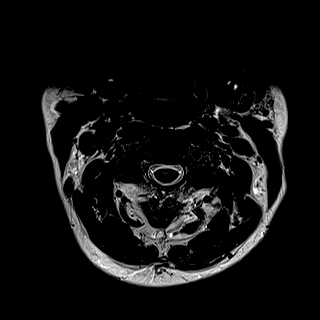
[im 30/30]
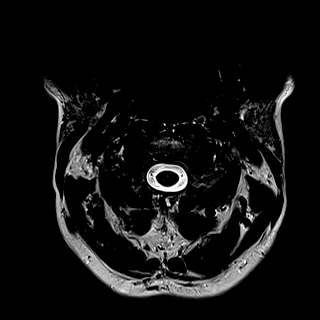

[27 of 48 positions shown; findings below may reference images not displayed]

FINDINGS: Alignment: Reversal of the expected cervical lordosis. Trace C3-C4
grade 1 anterolisthesis. Trace C4-C5 and C5-C6 grade 1
retrolisthesis.

Vertebrae: Vertebral body height is maintained. Multilevel
degenerative endplate irregularity and mixed degenerative endplate
marrow signal. This includes mild degenerative endplate edema at
C5-C6.

Cord: No spinal cord signal abnormality is identified.

Posterior Fossa, vertebral arteries, paraspinal tissues: Posterior
fossa better assessed on concurrently performed brain MRI. Flow
voids preserved within the imaged cervical vertebral arteries.
Paraspinal soft tissues within normal limits.

Disc levels:

Multilevel disc degeneration. Most notably, moderate/severe disc
degeneration is present at C4-C5, C5-C6 and C6-C7.

C2-C3: No significant disc herniation or spinal canal stenosis.
Uncovertebral hypertrophy and facet arthrosis. Minimal relative
right neural foraminal narrowing.

C3-C4: Trace grade 1 anterolisthesis. Small central disc protrusion.
Uncovertebral hypertrophy (greater on the right). Facet arthrosis
(greater on the right). The disc protrusion contributes to minimal
relative spinal canal narrowing without spinal cord mass effect.
Moderate/severe right neural foraminal narrowing.

C4-C5: Trace grade 1 retrolisthesis. Disc bulge. Bilateral disc
osteophyte ridge/uncinate hypertrophy. Facet arthrosis. Minimal
relative spinal canal narrowing without spinal cord mass effect.
Bilateral neural foraminal narrowing (severe right, moderate/severe
left).

C5-C6: Trace grade 1 retrolisthesis. Disc bulge. Bilateral disc
osteophyte ridge/uncinate hypertrophy. Facet arthrosis. Mild
ligamentum flavum hypertrophy. Mild spinal canal stenosis without
spinal cord mass effect. Severe bilateral neural foraminal narrowing

C6-C7: Disc bulge with bilateral disc osteophyte ridge/uncinate
hypertrophy. Facet arthrosis. Ligamentum flavum hypertrophy. Mild
spinal canal stenosis without spinal cord mass effect. Severe
bilateral neural foraminal narrowing.

C7-T1: Mild facet arthrosis. No significant disc herniation or
stenosis.
IMPRESSION: Cervical spondylosis as outlined. No more than mild spinal canal
stenosis. Multilevel neural foraminal narrowing greatest on the
right at C3-C4 (moderate/severe), bilaterally at C4-C5 (severe
right, moderate/severe left), bilaterally at C5-C6 (severe) and
bilaterally at C6-C7 (severe).

Also of note, disc degeneration is moderate/severe at C4-C5, C5-C6
and C6-C7. Associated mild degenerative endplate edema at C5-C6.

Nonspecific reversal of the expected cervical lordosis. Trace
multilevel spondylolisthesis, as detailed.

## 2021-03-25 IMAGING — MR MR HEAD W/O CM
12 series · 48 of 48 positions shown · non-contrast
Comparison: Head CT [DATE].

CLINICAL DATA: Mild cognitive impairment with memory loss. Facial
pain. Additional history provided by scanning technologist: Patient
reports history of migraines and numbness in bilateral shoulders,
arms, hands and new onset numbness in bilateral first toes.

EXAM:
MRI HEAD WITHOUT CONTRAST
TECHNIQUE: Multiplanar, multiecho pulse sequences of the brain and surrounding
structures were obtained without intravenous contrast.

[Series 5: T1 · sagittal · 4.0mm · 0.75mm/px · 1 of 31 slices shown (1 of 2)]
[im 1/31]
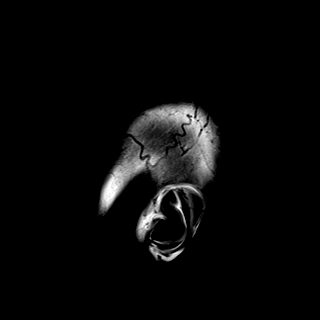

[Series 6: DWI · axial · 3.0mm · 0.94mm/px · z∈[-84,+56]mm · 9 of 160 slices shown (1 of 3)]
[im 1/160]
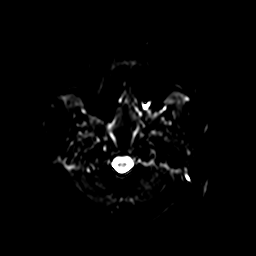
[im 20/160]
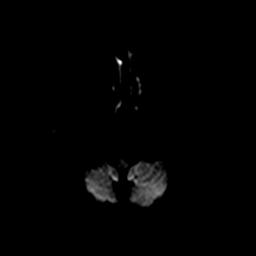
[im 40/160]
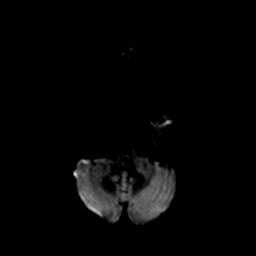
[im 60/160]
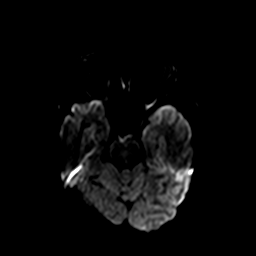
[im 80/160]
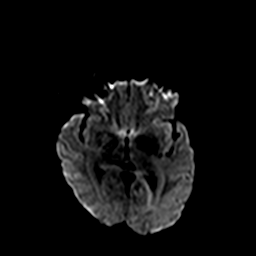
[im 100/160]
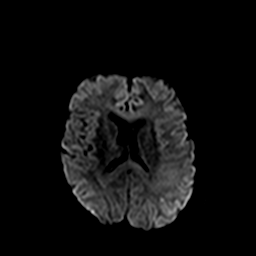
[im 120/160]
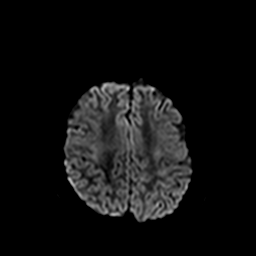
[im 140/160]
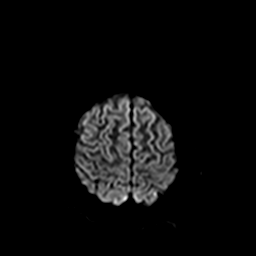
[im 160/160]
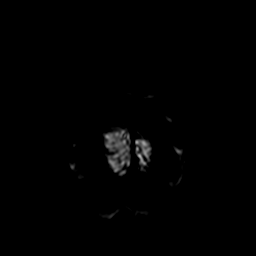

[Series 7: ax dwi_tracew · axial · 3.0mm · 0.94mm/px · z∈[-84,+56]mm · 5 of 80 slices shown]
[im 1/80]
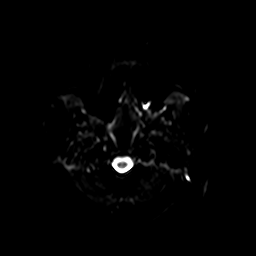
[im 20/80]
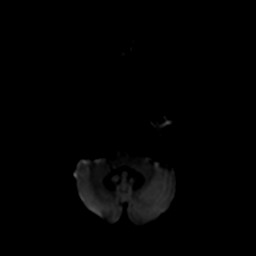
[im 40/80]
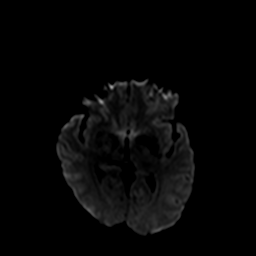
[im 60/80]
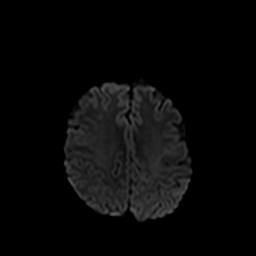
[im 80/80]
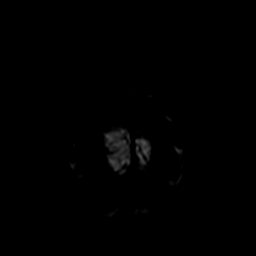

[Series 8: ax dwi_adc · axial · 3.0mm · 0.94mm/px · z∈[-84,+56]mm · 3 of 40 slices shown]
[im 1/40]
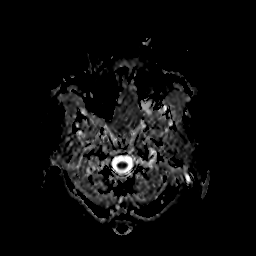
[im 20/40]
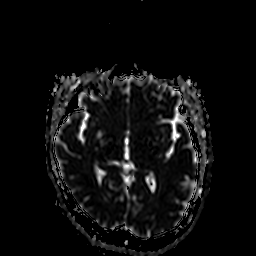
[im 40/40]
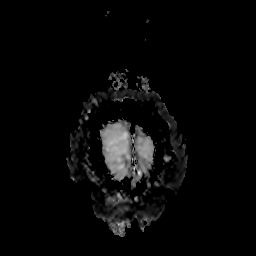

[Series 9: DWI · coronal · 5.0mm · 1.44mm/px · 4 of 60 slices shown (2 of 3)]
[im 1/60]
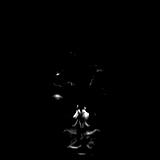
[im 20/60]
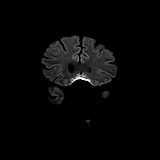
[im 40/60]
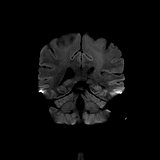
[im 60/60]
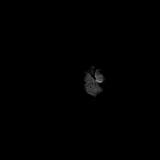

[Series 10: DWI · coronal · 5.0mm · 1.44mm/px · 2 of 30 slices shown (3 of 3)]
[im 1/30]
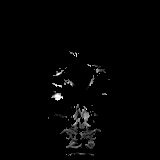
[im 30/30]
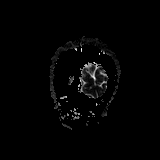

[Series 11: T2 · axial · 4.0mm · 0.36mm/px · z∈[-79,+56]mm · 2 of 27 slices shown (1 of 2)]
[im 1/27]
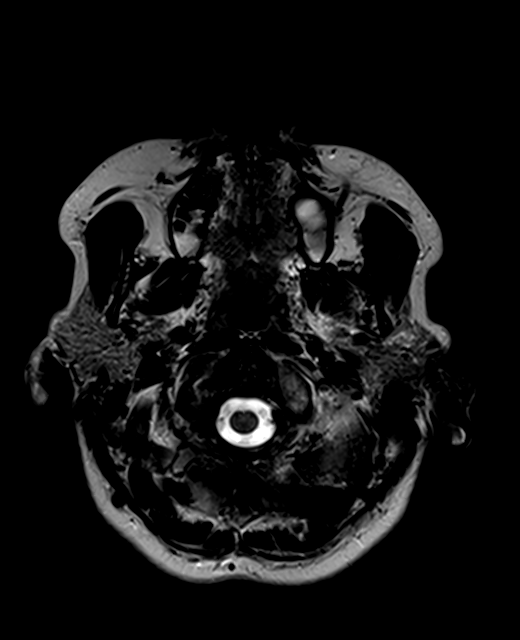
[im 27/27]
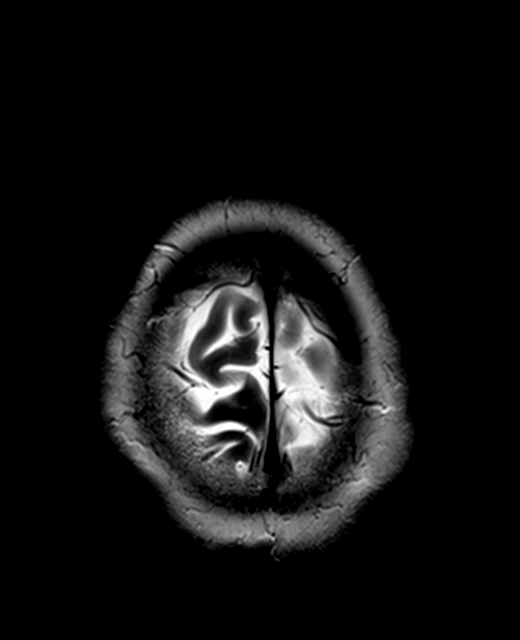

[Series 12: FLAIR · axial · 3.0mm · 0.72mm/px · z∈[-88,+61]mm · 2 of 26 slices shown (1 of 2)]
[im 1/26]
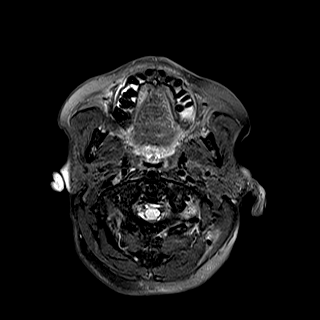
[im 26/26]
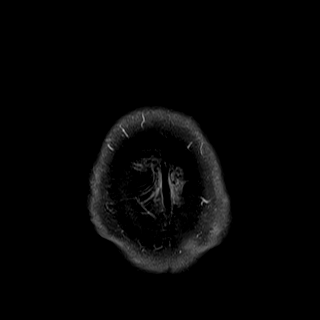

[Series 13: swi_images · axial · 1.5mm · 0.90mm/px · z∈[-83,+59]mm · 6 of 96 slices shown]
[im 1/96]
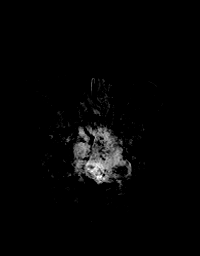
[im 20/96]
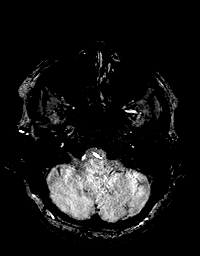
[im 39/96]
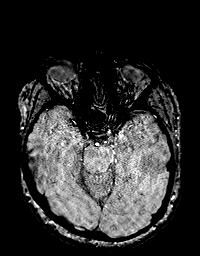
[im 58/96]
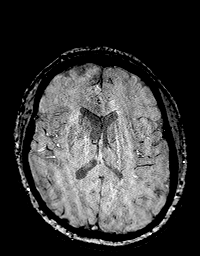
[im 77/96]
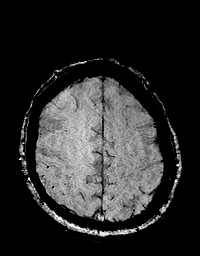
[im 96/96]
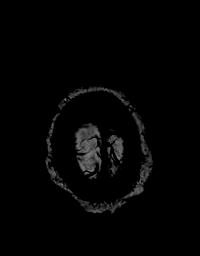

[Series 15: T1 · axial · 1.0mm · 0.94mm/px · z∈[-91,+68]mm · 10 of 160 slices shown (2 of 2)]
[im 1/160]
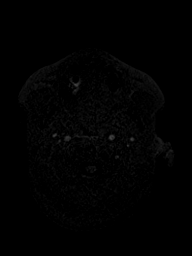
[im 18/160]
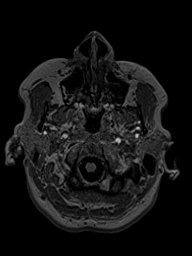
[im 36/160]
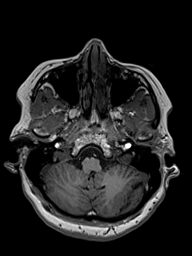
[im 54/160]
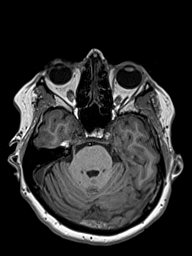
[im 71/160]
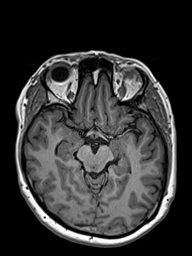
[im 89/160]
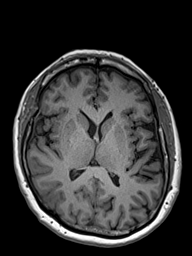
[im 107/160]
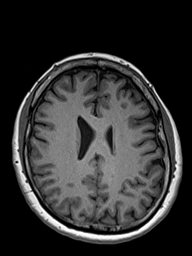
[im 124/160]
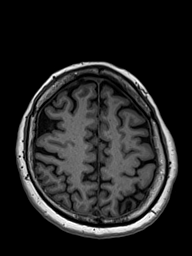
[im 142/160]
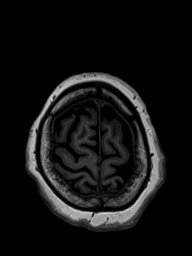
[im 160/160]
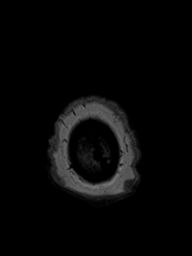

[Series 16: T2 · coronal · 4.5mm · 0.36mm/px · 2 of 30 slices shown (2 of 2)]
[im 1/30]
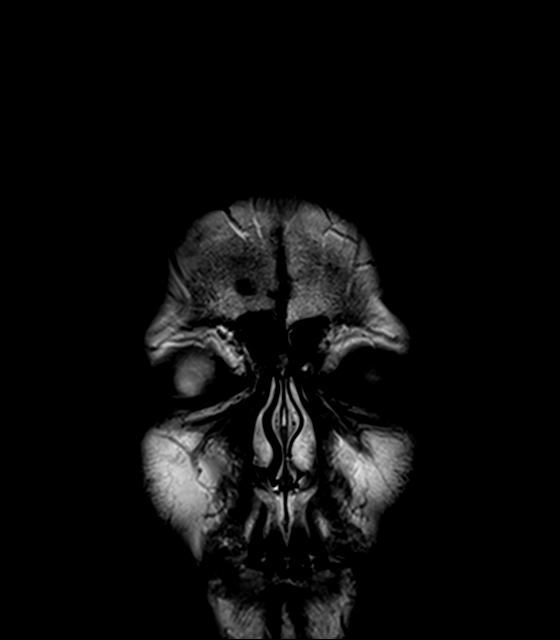
[im 30/30]
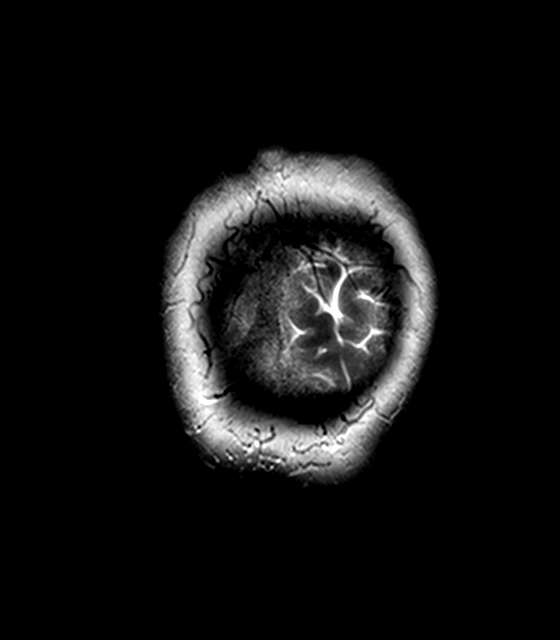

[Series 17: FLAIR · sagittal · 4.0mm · 0.72mm/px · 2 of 27 slices shown (2 of 2)]
[im 1/27]
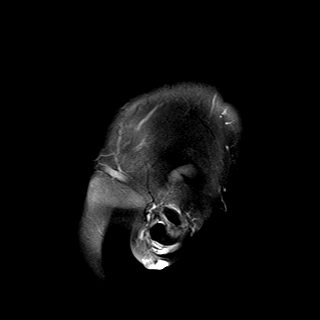
[im 27/27]
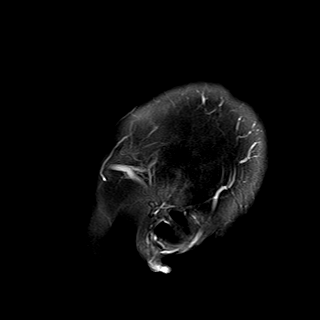

[48 of 48 positions shown; findings below may reference images not displayed]

FINDINGS: Brain:

Mild generalized cerebral and cerebellar atrophy.

Mild multifocal T2/FLAIR hyperintensity within the cerebral white
matter.

There is no acute infarct.

No evidence of intracranial mass.

No chronic intracranial blood products.

No extra-axial fluid collection.

No midline shift.

Vascular: Expected proximal arterial flow voids.

Skull and upper cervical spine: No focal marrow lesion.

Sinuses/Orbits: Visualized orbits show no acute finding. Small
mucous retention cysts within the bilateral maxillary sinuses, the
largest on the left measuring 17 mm. Trace bilateral ethmoid sinus
mucosal thickening.
IMPRESSION: No evidence of acute intracranial abnormality.

Mild scattered T2 hyperintense signal changes within the cerebral
white matter, nonspecific but most often secondary to chronic small
vessel ischemia. Potential alternative considerations would include
sequela of a prior infectious/inflammatory process, migraine
headaches or a demyelinating process such as multiple sclerosis,
among others.

Mild generalized parenchymal atrophy.

Paranasal sinus disease, as described.

## 2021-05-19 ENCOUNTER — Encounter (HOSPITAL_COMMUNITY): Payer: Self-pay | Admitting: Emergency Medicine

## 2021-05-19 ENCOUNTER — Other Ambulatory Visit: Payer: Self-pay

## 2021-05-19 ENCOUNTER — Emergency Department (HOSPITAL_COMMUNITY)
Admission: EM | Admit: 2021-05-19 | Discharge: 2021-05-20 | Disposition: A | Discharge status: Home / Self Care | Payer: 59 | Attending: Emergency Medicine | Admitting: Emergency Medicine

## 2021-05-19 ENCOUNTER — Emergency Department (HOSPITAL_COMMUNITY): Payer: 59

## 2021-05-19 DIAGNOSIS — R0602 Shortness of breath: Secondary | ICD-10-CM

## 2021-05-19 DIAGNOSIS — Z87891 Personal history of nicotine dependence: Secondary | ICD-10-CM | POA: Insufficient documentation

## 2021-05-19 DIAGNOSIS — U071 COVID-19: Secondary | ICD-10-CM | POA: Diagnosis not present

## 2021-05-19 LAB — COMPREHENSIVE METABOLIC PANEL
ALT: 29 U/L (ref 0–44)
AST: 17 U/L (ref 15–41)
Albumin: 4.1 g/dL (ref 3.5–5.0)
Alkaline Phosphatase: 54 U/L (ref 38–126)
Anion gap: 9 (ref 5–15)
BUN: 9 mg/dL (ref 6–20)
CO2: 24 mmol/L (ref 22–32)
Calcium: 9.2 mg/dL (ref 8.9–10.3)
Chloride: 103 mmol/L (ref 98–111)
Creatinine, Ser: 0.78 mg/dL (ref 0.44–1.00)
GFR, Estimated: >60 mL/min (ref >60)
Glucose, Bld: 104 mg/dL — ABNORMAL HIGH (ref 70–99)
Potassium: 3.8 mmol/L (ref 3.5–5.1)
Sodium: 136 mmol/L (ref 135–145)
Total Bilirubin: 0.5 mg/dL (ref 0.3–1.2)
Total Protein: 6.8 g/dL (ref 6.5–8.1)

## 2021-05-19 LAB — CBC WITH DIFFERENTIAL/PLATELET
Abs Immature Granulocytes: 0.03 10*3/uL (ref 0.00–0.07)
Basophils Absolute: 0 10*3/uL (ref 0.0–0.1)
Basophils Relative: 1 %
Eosinophils Absolute: 0.1 10*3/uL (ref 0.0–0.5)
Eosinophils Relative: 2 %
HCT: 42.2 % (ref 36.0–46.0)
Hemoglobin: 13.7 g/dL (ref 12.0–15.0)
Immature Granulocytes: 1 %
Lymphocytes Relative: 35 %
Lymphs Abs: 2 10*3/uL (ref 0.7–4.0)
MCH: 30.1 pg (ref 26.0–34.0)
MCHC: 32.5 g/dL (ref 30.0–36.0)
MCV: 92.7 fL (ref 80.0–100.0)
Monocytes Absolute: 0.7 10*3/uL (ref 0.1–1.0)
Monocytes Relative: 13 %
Neutro Abs: 2.9 10*3/uL (ref 1.7–7.7)
Neutrophils Relative %: 48 %
Platelets: 307 10*3/uL (ref 150–400)
RBC: 4.55 MIL/uL (ref 3.87–5.11)
RDW: 13.2 % (ref 11.5–15.5)
WBC: 5.8 10*3/uL (ref 4.0–10.5)
nRBC: 0 % (ref 0.0–0.2)

## 2021-05-19 IMAGING — DX DG CHEST 1V PORT
1 series · 1 of 1 positions shown · non-contrast
Comparison: [DATE].

CLINICAL DATA: COVID.  Cough.  Shortness of breath.

EXAM:
PORTABLE CHEST 1 VIEW

[chest ap]
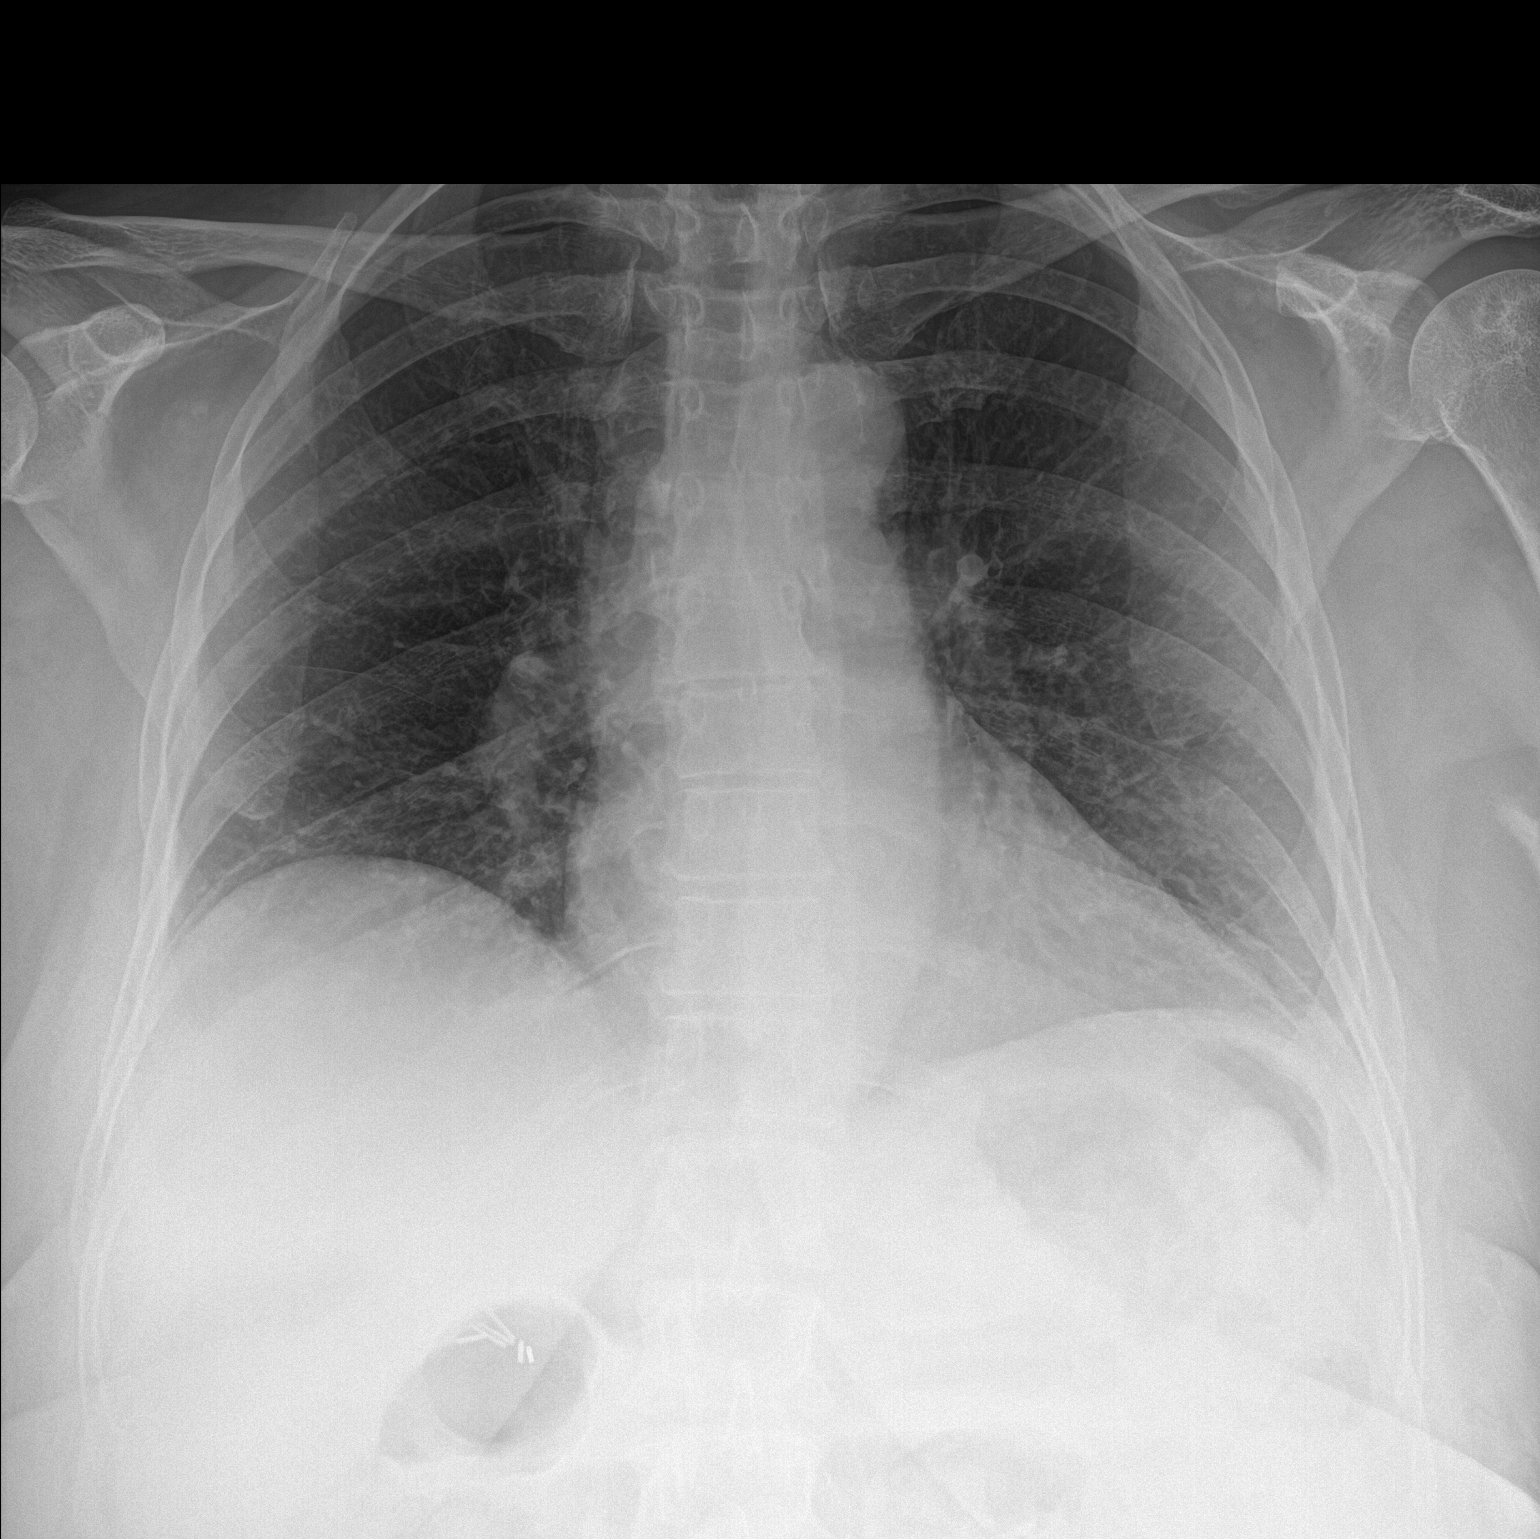

[1 of 1 positions shown; findings below may reference images not displayed]

FINDINGS: Mediastinum and hilar structures normal. Heart size normal. Very
mild bilateral interstitial prominence. Mild pneumonitis cannot be
excluded. No pleural effusion or pneumothorax. Surgical clips right
upper quadrant.
IMPRESSION: Very mild bilateral interstitial prominence. Mild pneumonitis cannot
be excluded.

## 2021-05-19 NOTE — ED Triage Notes (Signed)
Pt here on day 8 of covid  was feeling ok but statrted having some sob over the last 2 days

## 2021-05-19 NOTE — ED Provider Notes (Signed)
Emergency Medicine Provider Triage Evaluation Note  Autumn Hicks , a 60 y.o. female  was evaluated in triage.  Pt complains of ongoing COVID-19 illness.  Patient with history of migraines, but is otherwise healthy.  Tested positive on 05/11/2021 for COVID-19.  She has been fully immunized.  She states that she had cough and shortness of breath that improved briefly before progressively worsening again.  She states that she is in here given dyspnea, worse with exertion.  Cough is nonproductive.  Denies hemoptysis.  No history of clots or clotting disorder, unilateral extremity swelling or edema, or chest pain.  Review of Systems  Positive: Cough, shortness of breath.   Negative: Fevers, chills, chest pain, hemoptysis, unilateral swelling.  Physical Exam  BP 124/84 (BP Location: Right Arm)   Pulse 87   Temp 98.1 F (36.7 C) (Oral)   Resp 20   SpO2 100%  Gen:   Awake, no distress   Resp:  Mildly increased effort. Coughing.  MSK:   Moves extremities without difficulty  Other:   Medical Decision Making  Medically screening exam initiated at 2:42 PM.  Appropriate orders placed.  Autumn Hicks was informed that the remainder of the evaluation will be completed by another provider, this initial triage assessment does not replace that evaluation, and the importance of remaining in the ED until their evaluation is complete.    Autumn Herter, PA-C 05/19/21 1442    Autumn Sorrow, MD 05/27/21 1351

## 2021-05-20 ENCOUNTER — Other Ambulatory Visit: Payer: Self-pay

## 2021-05-20 MED ORDER — PREDNISONE 20 MG PO TABS: ORAL_TABLET | ORAL | 0 refills | Status: AC

## 2021-05-20 MED ORDER — ALBUTEROL SULFATE HFA 108 (90 BASE) MCG/ACT IN AERS: 1.0000 | INHALATION_SPRAY | Freq: Four times a day (QID) | RESPIRATORY_TRACT | 0 refills | Status: AC | PRN

## 2021-05-20 NOTE — ED Notes (Signed)
Provider at bedside

## 2021-05-20 NOTE — Discharge Instructions (Addendum)
Recommended scheduled inhaler treatments-- 8 puffs is equal to 1 neb treatment.  Can do this every 4-6 hours.  Can also continue to use 2 puffs here and there for rescue when needed. Take steroids in the morning-- they can affect sleep and appetite a little which is normal. Follow-up with your primary care doctor. Return here for new concerns.

## 2021-05-20 NOTE — ED Provider Notes (Signed)
[facility] EMERGENCY DEPARTMENT Provider Note   CSN: [id number] Arrival date & time: 05/19/21  1301     History Chief Complaint  Patient presents with   Covid Positive     Autumn Hicks is a 60 y.o. female.  The history is provided by the patient and medical records.   60 y.o. F with hx of anxiety, depression, migraine headaches, presenting to the ED with SOB.  Diagnosed with covid-19 05/11/21.  States she completed the course of Paxlovid already and thought she was getting better but symptoms worsened again 2 days ago.  States now her biggest issue is feeling SOB.  This mostly occurs with exertion and at night when lying flat so has been sleeping propped up which helps.  Cough remains overall dry but she does feel chest congestion.  She denies any hemoptysis or actual chest pain.  Her doctor did give her albuterol inhaler and she has been using 2 puffs once daily as directed but has not noticed any significant relief.  She has no baseline respiratory issues and is not on home O2.   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     Family History  Problem Relation Age of Onset   Breast cancer Other    Breast cancer Maternal Aunt 35   Breast cancer Sister 75    Social History   Tobacco Use   Smoking status: Former    Pack years: 0.00    Types: Cigarettes    Quit date: 11/29/2011    Years since quitting: 9.4  Substance Use Topics    Alcohol use: Yes    Comment: Three times a week.    Drug use: No    Home Medications Prior to Admission medications   Medication Sig Start Date End Date Taking? Authorizing Provider  albuterol (PROVENTIL HFA;VENTOLIN HFA) 108 (90 BASE) MCG/ACT inhaler Inhale 2 puffs into the lungs every 4 (four) hours as needed for wheezing or shortness of breath (or coughing). 69/67/89   Delora Fuel, MD  cetirizine (ZYRTEC) 10 MG tablet Take 1 tablet (10 mg total) by mouth daily. One tab daily for allergies 02/05/13   Billy Fischer, MD  cholecalciferol (VITAMIN D) 1000 UNITS tablet Take 2,000 Units by mouth daily.    [provider]  cyanocobalamin (,VITAMIN B-12,) 1000 MCG/ML injection Inject 1,000 mcg into the muscle every 14 (fourteen) days.    [provider]  doxycycline (VIBRAMYCIN) 100 MG capsule Take 1 capsule (100 mg total) by mouth 2 (two) times daily. 38/10/17   Delora Fuel, MD  DULoxetine (CYMBALTA) 30 MG capsule Take 30 mg by mouth 2 (two) times daily.    [provider]  Esomeprazole Magnesium (NEXIUM PO) Take 22.3 mg by mouth daily.     [provider]  fluticasone Asencion Islam)  50 MCG/ACT nasal spray Place 1 spray into both nostrils daily. 04/05/15   Menshew, Dannielle Karvonen, PA-C  montelukast (SINGULAIR) 10 MG tablet Take 10 mg by mouth daily. 10/19/15   [provider]  phentermine (ADIPEX-P) 37.5 MG tablet Take 37.5 mg by mouth daily. 09/26/15   [provider]  predniSONE (DELTASONE) 50 MG tablet Take 1 tablet (50 mg total) by mouth daily. 94/49/67   Delora Fuel, MD    Allergies    Sulfa antibiotics  Review of Systems   Review of Systems  Respiratory:  Positive for shortness of breath.   All other systems reviewed and are negative.  Physical Exam Updated Vital Signs BP 128/77   Pulse 92   Temp (!) 97.5 F (36.4 C) (Oral)   Resp (!) 30   Ht 5\' 6"  (1.676 m)   Wt 83.5 kg   SpO2 100%   BMI 29.70 kg/m   Physical Exam Vitals  and nursing note reviewed.  Constitutional:      Appearance: She is well-developed.  HENT:     Head: Normocephalic and atraumatic.  Eyes:     Conjunctiva/sclera: Conjunctivae normal.     Pupils: Pupils are equal, round, and reactive to light.  Cardiovascular:     Rate and Rhythm: Normal rate and regular rhythm.     Heart sounds: Normal heart sounds.  Pulmonary:     Effort: Pulmonary effort is normal. No respiratory distress.     Breath sounds: Normal breath sounds. No rhonchi.     Comments: Dry cough noted during exam, O2 sats remaining between 98-100% on RA during conversation Abdominal:     General: Bowel sounds are normal.     Palpations: Abdomen is soft.  Musculoskeletal:        General: Normal range of motion.     Cervical back: Normal range of motion.  Skin:    General: Skin is warm and dry.  Neurological:     Mental Status: She is alert and oriented to person, place, and time.    ED Results / Procedures / Treatments   Labs (all labs ordered are listed, but only abnormal results are displayed) Labs Reviewed  COMPREHENSIVE METABOLIC PANEL - Abnormal; Notable for the following components:      Result Value   Glucose, Bld 104 (*)    All other components within normal limits  CBC WITH DIFFERENTIAL/PLATELET    EKG EKG Interpretation  Date/Time:  Wednesday May 19 2021 14:45:20 EDT Ventricular Rate:  82 PR Interval:  172 QRS Duration: 78 QT Interval:  370 QTC Calculation: 432 R Axis:   45 Text Interpretation: Normal sinus rhythm Normal ECG No old tracing to compare Confirmed by Daleen Bo [phone]) on 05/19/2021 9:59:18 PM  Radiology DG Chest Portable 1 View  Result Date: 05/19/2021 CLINICAL DATA:  COVID.  Cough.  Shortness of breath. EXAM: PORTABLE CHEST 1 VIEW COMPARISON:  10/26/2015. FINDINGS: Mediastinum and hilar structures normal. Heart size normal. Very mild bilateral interstitial prominence. Mild pneumonitis cannot be excluded. No pleural effusion or  pneumothorax. Surgical clips right upper quadrant. IMPRESSION: Very mild bilateral interstitial prominence. Mild pneumonitis cannot be excluded. Electronically Signed   By: Marcello Moores  Register   On: 05/19/2021 15:25    Procedures Procedures   Medications Ordered in ED Medications - No data to display  ED Course  I have reviewed the triage vital signs and the nursing notes.  Pertinent labs & imaging results that were available during my care of the patient  were reviewed by me and considered in my medical decision making (see chart for details).    MDM Rules/Calculators/A&P  60 year old female presenting to the ED with shortness of breath.  Diagnosed COVID-positive 05/11/2021 and already completed course of Paxlovid.  Was feeling better until about 2 days ago when difficulty breathing worsened.  Has been using inhaler once daily without relief.  She is afebrile and nontoxic in appearance here.  Her vitals are stable on room air.  She does have a dry cough.  Labs are overall reassuring.  Chest x-ray with some findings of likely mild pneumonitis.  Suspect this is COVID-related and source of her increased SOB.  She has no tachycardia or hypoxia, no hemoptysis, no chest pain, and no history of DVT or PE.  She was able to ambulate in the room on pulse ox and maintain saturations just as she did at rest.  Feel she is stable for discharge home.  May benefit from increasing albuterol use and short steroid burst.  Patient is agreeable.  Encouraged close PCP follow-up.  She will return here for any new or acute changes.  Final Clinical Impression(s) / ED Diagnoses Final diagnoses:  COVID-19  Shortness of breath    Rx / DC Orders ED Discharge Orders          Ordered    predniSONE (DELTASONE) 20 MG tablet        05/20/21 0035    albuterol (VENTOLIN HFA) 108 (90 Base) MCG/ACT inhaler  Every 6 hours PRN        05/20/21 0035             Larene Pickett, PA-C 83/46/21 9471    Delora Fuel,  MD 25/27/12 [phone]

## 2022-02-23 ENCOUNTER — Other Ambulatory Visit: Payer: Self-pay | Admitting: Family Medicine

## 2022-02-23 DIAGNOSIS — Z1231 Encounter for screening mammogram for malignant neoplasm of breast: Secondary | ICD-10-CM

## 2022-03-29 ENCOUNTER — Ambulatory Visit: Payer: 59

## 2022-04-04 ENCOUNTER — Ambulatory Visit: Payer: 59

## 2022-04-26 ENCOUNTER — Ambulatory Visit: Payer: 59 | Admitting: Allergy & Immunology

## 2022-08-26 ENCOUNTER — Ambulatory Visit
Admission: RE | Admit: 2022-08-26 | Discharge: 2022-08-26 | Disposition: A | Discharge status: Home / Self Care | Payer: 59 | Source: Ambulatory Visit | Attending: Family Medicine | Admitting: Family Medicine

## 2022-08-26 DIAGNOSIS — Z1231 Encounter for screening mammogram for malignant neoplasm of breast: Secondary | ICD-10-CM

## 2023-09-05 ENCOUNTER — Other Ambulatory Visit: Payer: Self-pay | Admitting: Family Medicine

## 2023-09-05 DIAGNOSIS — Z1231 Encounter for screening mammogram for malignant neoplasm of breast: Secondary | ICD-10-CM

## 2023-09-08 ENCOUNTER — Ambulatory Visit
Admission: RE | Admit: 2023-09-08 | Discharge: 2023-09-08 | Disposition: A | Discharge status: Home / Self Care | Payer: 59 | Source: Ambulatory Visit | Attending: Family Medicine | Admitting: Family Medicine

## 2023-09-08 DIAGNOSIS — Z1231 Encounter for screening mammogram for malignant neoplasm of breast: Secondary | ICD-10-CM

## 2023-09-13 ENCOUNTER — Other Ambulatory Visit: Payer: Self-pay | Admitting: Family Medicine

## 2023-09-13 DIAGNOSIS — R928 Other abnormal and inconclusive findings on diagnostic imaging of breast: Secondary | ICD-10-CM

## 2023-10-20 ENCOUNTER — Other Ambulatory Visit: Payer: Self-pay | Admitting: Family Medicine

## 2023-10-20 ENCOUNTER — Ambulatory Visit
Admission: RE | Admit: 2023-10-20 | Discharge: 2023-10-20 | Disposition: A | Discharge status: Home / Self Care | Payer: 59 | Source: Ambulatory Visit | Attending: Family Medicine | Admitting: Family Medicine

## 2023-10-20 DIAGNOSIS — R921 Mammographic calcification found on diagnostic imaging of breast: Secondary | ICD-10-CM

## 2023-10-20 DIAGNOSIS — R928 Other abnormal and inconclusive findings on diagnostic imaging of breast: Secondary | ICD-10-CM

## 2023-10-30 ENCOUNTER — Other Ambulatory Visit: Payer: Self-pay | Admitting: Family Medicine

## 2023-10-30 DIAGNOSIS — R928 Other abnormal and inconclusive findings on diagnostic imaging of breast: Secondary | ICD-10-CM

## 2023-11-03 ENCOUNTER — Ambulatory Visit
Admission: RE | Admit: 2023-11-03 | Discharge: 2023-11-03 | Disposition: A | Discharge status: Home / Self Care | Payer: 59 | Source: Ambulatory Visit | Attending: Family Medicine | Admitting: Family Medicine

## 2023-11-03 DIAGNOSIS — R928 Other abnormal and inconclusive findings on diagnostic imaging of breast: Secondary | ICD-10-CM

## 2023-11-03 HISTORY — PX: BREAST BIOPSY: SHX20

## 2023-11-06 LAB — SURGICAL PATHOLOGY

## 2024-10-07 ENCOUNTER — Other Ambulatory Visit: Payer: Self-pay

## 2024-10-07 DIAGNOSIS — Z1231 Encounter for screening mammogram for malignant neoplasm of breast: Secondary | ICD-10-CM

## 2024-10-15 ENCOUNTER — Ambulatory Visit
Admission: RE | Admit: 2024-10-15 | Discharge: 2024-10-15 | Disposition: A | Discharge status: Home / Self Care | Source: Ambulatory Visit

## 2024-10-15 DIAGNOSIS — Z1231 Encounter for screening mammogram for malignant neoplasm of breast: Secondary | ICD-10-CM
